# Patient Record
Sex: Male | Born: 1950 | Race: White | Hispanic: No | Marital: Single
Health system: Southern US, Community
[De-identification: ages and names within clinical notes are randomized; demographics above are authoritative.]

---

## 2004-08-24 ENCOUNTER — Emergency Department: Payer: Self-pay | Admitting: Emergency Medicine

## 2009-03-28 ENCOUNTER — Ambulatory Visit: Payer: Self-pay | Admitting: Vascular Surgery

## 2011-06-07 ENCOUNTER — Ambulatory Visit: Payer: Self-pay

## 2011-06-15 ENCOUNTER — Inpatient Hospital Stay: Payer: Self-pay | Admitting: Internal Medicine

## 2011-06-15 LAB — CBC
HCT: 33.2 % — ABNORMAL LOW (ref 40.0–52.0)
HGB: 10.4 g/dL — ABNORMAL LOW (ref 13.0–18.0)
MCH: 25.6 pg — ABNORMAL LOW (ref 26.0–34.0)
MCHC: 31.2 g/dL — ABNORMAL LOW (ref 32.0–36.0)
RDW: 21.9 % — ABNORMAL HIGH (ref 11.5–14.5)

## 2011-06-15 LAB — COMPREHENSIVE METABOLIC PANEL
Albumin: 3.1 g/dL — ABNORMAL LOW (ref 3.4–5.0)
Alkaline Phosphatase: 95 U/L (ref 50–136)
Anion Gap: 9 (ref 7–16)
Bilirubin,Total: 0.8 mg/dL (ref 0.2–1.0)
Calcium, Total: 7.9 mg/dL — ABNORMAL LOW (ref 8.5–10.1)
Co2: 34 mmol/L — ABNORMAL HIGH (ref 21–32)
Creatinine: 4.4 mg/dL — ABNORMAL HIGH (ref 0.60–1.30)
EGFR (Non-African Amer.): 15 — ABNORMAL LOW
Glucose: 140 mg/dL — ABNORMAL HIGH (ref 65–99)
Osmolality: 287 (ref 275–301)
SGOT(AST): 20 U/L (ref 15–37)
Sodium: 142 mmol/L (ref 136–145)

## 2011-06-15 LAB — CK TOTAL AND CKMB (NOT AT ARMC)
CK, Total: 43 U/L (ref 35–232)
CK-MB: 0.7 ng/mL (ref 0.5–3.6)

## 2011-06-15 LAB — TROPONIN I
Troponin-I: <0.02 ng/mL
Troponin-I: <0.02 ng/mL

## 2011-06-15 LAB — PROTIME-INR
INR: 1.1
Prothrombin Time: 15 secs — ABNORMAL HIGH (ref 11.5–14.7)

## 2011-06-16 LAB — CBC WITH DIFFERENTIAL/PLATELET
Basophil #: 0 10*3/uL (ref 0.0–0.1)
Eosinophil #: 0.1 10*3/uL (ref 0.0–0.7)
Lymphocyte %: 18.3 %
MCH: 25.5 pg — ABNORMAL LOW (ref 26.0–34.0)
MCHC: 31 g/dL — ABNORMAL LOW (ref 32.0–36.0)
Monocyte #: 0.4 10*3/uL (ref 0.0–0.7)
Neutrophil %: 71.5 %
Platelet: 212 10*3/uL (ref 150–440)
RBC: 3.85 10*6/uL — ABNORMAL LOW (ref 4.40–5.90)
RDW: 21.8 % — ABNORMAL HIGH (ref 11.5–14.5)
WBC: 5 10*3/uL (ref 3.8–10.6)

## 2011-06-16 LAB — CK TOTAL AND CKMB (NOT AT ARMC)
CK, Total: 30 U/L — ABNORMAL LOW (ref 35–232)
CK, Total: 36 U/L (ref 35–232)
CK-MB: <0.5 ng/mL — ABNORMAL LOW (ref 0.5–3.6)
CK-MB: 0.7 ng/mL (ref 0.5–3.6)

## 2011-06-16 LAB — BASIC METABOLIC PANEL
Anion Gap: 9 (ref 7–16)
Calcium, Total: 7.8 mg/dL — ABNORMAL LOW (ref 8.5–10.1)
Chloride: 99 mmol/L (ref 98–107)
EGFR (Non-African Amer.): 11 — ABNORMAL LOW
Glucose: 86 mg/dL (ref 65–99)
Osmolality: 280 (ref 275–301)
Potassium: 3 mmol/L — ABNORMAL LOW (ref 3.5–5.1)

## 2011-06-16 LAB — TROPONIN I: Troponin-I: <0.02 ng/mL

## 2011-06-17 LAB — CBC WITH DIFFERENTIAL/PLATELET
Basophil #: 0 10*3/uL (ref 0.0–0.1)
Basophil %: 0.6 %
Eosinophil #: 0.2 10*3/uL (ref 0.0–0.7)
Eosinophil %: 2.6 %
HCT: 30.3 % — ABNORMAL LOW (ref 40.0–52.0)
HGB: 9.5 g/dL — ABNORMAL LOW (ref 13.0–18.0)
Lymphocyte #: 1 10*3/uL (ref 1.0–3.6)
Lymphocyte %: 16.5 %
MCH: 25.7 pg — ABNORMAL LOW (ref 26.0–34.0)
MCHC: 31.5 g/dL — ABNORMAL LOW (ref 32.0–36.0)
MCV: 82 fL (ref 80–100)
Monocyte #: 0.4 10*3/uL (ref 0.0–0.7)
Monocyte %: 7 %
Neutrophil #: 4.4 10*3/uL (ref 1.4–6.5)
Neutrophil %: 73.3 %
Platelet: 209 10*3/uL (ref 150–440)
RBC: 3.71 10*6/uL — ABNORMAL LOW (ref 4.40–5.90)
RDW: 21.2 % — ABNORMAL HIGH (ref 11.5–14.5)
WBC: 6 10*3/uL (ref 3.8–10.6)

## 2011-06-17 LAB — BASIC METABOLIC PANEL
Anion Gap: 11 (ref 7–16)
BUN: 34 mg/dL — ABNORMAL HIGH (ref 7–18)
Calcium, Total: 7.9 mg/dL — ABNORMAL LOW (ref 8.5–10.1)
Chloride: 100 mmol/L (ref 98–107)
Co2: 31 mmol/L (ref 21–32)
Creatinine: 8.15 mg/dL — ABNORMAL HIGH (ref 0.60–1.30)
EGFR (African American): 9 — ABNORMAL LOW
EGFR (Non-African Amer.): 7 — ABNORMAL LOW
Glucose: 122 mg/dL — ABNORMAL HIGH (ref 65–99)
Osmolality: 292 (ref 275–301)
Potassium: 3.9 mmol/L (ref 3.5–5.1)
Sodium: 142 mmol/L (ref 136–145)

## 2011-06-17 LAB — PROTIME-INR
INR: 1.2
Prothrombin Time: 15.2 secs — ABNORMAL HIGH (ref 11.5–14.7)

## 2011-06-17 LAB — APTT: Activated PTT: 41.1 secs — ABNORMAL HIGH (ref 23.6–35.9)

## 2011-06-18 LAB — CBC WITH DIFFERENTIAL/PLATELET
Basophil #: 0.1 10*3/uL (ref 0.0–0.1)
Basophil %: 0.7 %
Eosinophil #: 0.2 10*3/uL (ref 0.0–0.7)
HGB: 9.7 g/dL — ABNORMAL LOW (ref 13.0–18.0)
Lymphocyte #: 0.9 10*3/uL — ABNORMAL LOW (ref 1.0–3.6)
Monocyte #: 0.5 10*3/uL (ref 0.0–0.7)
Monocyte %: 5.7 %
Neutrophil #: 6.5 10*3/uL (ref 1.4–6.5)
Platelet: 245 10*3/uL (ref 150–440)
RBC: 3.77 10*6/uL — ABNORMAL LOW (ref 4.40–5.90)
WBC: 8.1 10*3/uL (ref 3.8–10.6)

## 2011-06-18 LAB — PHOSPHORUS: Phosphorus: 4.8 mg/dL (ref 2.5–4.9)

## 2011-06-19 LAB — CLOSTRIDIUM DIFFICILE BY PCR

## 2011-06-20 LAB — WBCS, STOOL

## 2011-07-04 ENCOUNTER — Other Ambulatory Visit: Payer: Self-pay

## 2011-07-05 ENCOUNTER — Inpatient Hospital Stay: Payer: Self-pay | Admitting: Student

## 2011-07-05 LAB — TSH: Thyroid Stimulating Horm: 8.02 u[IU]/mL — ABNORMAL HIGH

## 2011-07-05 LAB — COMPREHENSIVE METABOLIC PANEL
Albumin: 3.2 g/dL — ABNORMAL LOW (ref 3.4–5.0)
Alkaline Phosphatase: 93 U/L (ref 50–136)
Anion Gap: 11 (ref 7–16)
BUN: 23 mg/dL — ABNORMAL HIGH (ref 7–18)
Bilirubin,Total: 0.9 mg/dL (ref 0.2–1.0)
Calcium, Total: 8.6 mg/dL (ref 8.5–10.1)
Co2: 28 mmol/L (ref 21–32)
SGOT(AST): 15 U/L (ref 15–37)
Total Protein: 7.5 g/dL (ref 6.4–8.2)

## 2011-07-05 LAB — CBC: Platelet: 237 10*3/uL (ref 150–440)

## 2011-07-06 LAB — BASIC METABOLIC PANEL
Anion Gap: 14 (ref 7–16)
BUN: 34 mg/dL — ABNORMAL HIGH (ref 7–18)
Chloride: 102 mmol/L (ref 98–107)
Creatinine: 8.31 mg/dL — ABNORMAL HIGH (ref 0.60–1.30)
EGFR (African American): 7 — ABNORMAL LOW
EGFR (Non-African Amer.): 6 — ABNORMAL LOW
Glucose: 119 mg/dL — ABNORMAL HIGH (ref 65–99)
Osmolality: 292 (ref 275–301)
Potassium: 3.7 mmol/L (ref 3.5–5.1)
Sodium: 142 mmol/L (ref 136–145)

## 2011-07-06 LAB — CBC WITH DIFFERENTIAL/PLATELET
Basophil #: 0.1 10*3/uL (ref 0.0–0.1)
Basophil %: 1.3 %
Eosinophil #: 0.1 10*3/uL (ref 0.0–0.7)
HGB: 8.1 g/dL — ABNORMAL LOW (ref 13.0–18.0)
Lymphocyte #: 0.8 10*3/uL — ABNORMAL LOW (ref 1.0–3.6)
Lymphocyte %: 16.4 %
MCHC: 29.8 g/dL — ABNORMAL LOW (ref 32.0–36.0)
MCV: 81 fL (ref 80–100)
Monocyte #: 0.4 x10 3/mm (ref 0.2–1.0)
Monocyte %: 7.3 %
Neutrophil %: 72.5 %
Platelet: 238 10*3/uL (ref 150–440)
RDW: 21.5 % — ABNORMAL HIGH (ref 11.5–14.5)
WBC: 4.9 10*3/uL (ref 3.8–10.6)

## 2011-07-06 LAB — PHOSPHORUS: Phosphorus: 6.1 mg/dL — ABNORMAL HIGH (ref 2.5–4.9)

## 2011-07-07 LAB — CBC WITH DIFFERENTIAL/PLATELET
Basophil #: 0.1 10*3/uL (ref 0.0–0.1)
Basophil %: 1 %
Eosinophil #: 0.1 10*3/uL (ref 0.0–0.7)
Eosinophil %: 1.7 %
HCT: 32.2 % — ABNORMAL LOW (ref 40.0–52.0)
HGB: 10 g/dL — ABNORMAL LOW (ref 13.0–18.0)
Lymphocyte #: 0.7 10*3/uL — ABNORMAL LOW (ref 1.0–3.6)
Lymphocyte %: 12.9 %
MCH: 25.2 pg — ABNORMAL LOW (ref 26.0–34.0)
MCHC: 31.2 g/dL — ABNORMAL LOW (ref 32.0–36.0)
MCV: 81 fL (ref 80–100)
Monocyte #: 0.4 x10 3/mm (ref 0.2–1.0)
Monocyte %: 6.7 %
Neutrophil #: 4.4 10*3/uL (ref 1.4–6.5)
Neutrophil %: 77.7 %
Platelet: 212 10*3/uL (ref 150–440)
RBC: 3.97 10*6/uL — ABNORMAL LOW (ref 4.40–5.90)
RDW: 20.2 % — ABNORMAL HIGH (ref 11.5–14.5)
WBC: 5.7 10*3/uL (ref 3.8–10.6)

## 2011-07-09 LAB — CBC WITH DIFFERENTIAL/PLATELET
Basophil #: 0.1 10*3/uL (ref 0.0–0.1)
Basophil %: 1.3 %
HGB: 9.4 g/dL — ABNORMAL LOW (ref 13.0–18.0)
Lymphocyte %: 13.7 %
MCH: 25.2 pg — ABNORMAL LOW (ref 26.0–34.0)
MCHC: 31.5 g/dL — ABNORMAL LOW (ref 32.0–36.0)
Monocyte #: 0.5 x10 3/mm (ref 0.2–1.0)
Monocyte %: 9.1 %
Platelet: 204 10*3/uL (ref 150–440)
WBC: 5.6 10*3/uL (ref 3.8–10.6)

## 2011-07-09 LAB — RENAL FUNCTION PANEL
Anion Gap: 15 (ref 7–16)
BUN: 42 mg/dL — ABNORMAL HIGH (ref 7–18)
Calcium, Total: 8.1 mg/dL — ABNORMAL LOW (ref 8.5–10.1)
Co2: 27 mmol/L (ref 21–32)
Creatinine: 10 mg/dL — ABNORMAL HIGH (ref 0.60–1.30)
EGFR (African American): 6 — ABNORMAL LOW
EGFR (Non-African Amer.): 5 — ABNORMAL LOW
Glucose: 58 mg/dL — ABNORMAL LOW (ref 65–99)
Potassium: 4 mmol/L (ref 3.5–5.1)

## 2011-07-09 LAB — HEMOGLOBIN: HGB: 9.8 g/dL — ABNORMAL LOW (ref 13.0–18.0)

## 2011-07-10 LAB — HEMOGLOBIN: HGB: 10.1 g/dL — ABNORMAL LOW (ref 13.0–18.0)

## 2011-07-16 LAB — PATHOLOGY REPORT

## 2011-07-27 ENCOUNTER — Ambulatory Visit: Payer: Self-pay | Admitting: Surgery

## 2011-07-27 LAB — CBC WITH DIFFERENTIAL/PLATELET
HGB: 8.9 g/dL — ABNORMAL LOW (ref 13.0–18.0)
Lymphocyte #: 0.7 10*3/uL — ABNORMAL LOW (ref 1.0–3.6)
MCH: 24.6 pg — ABNORMAL LOW (ref 26.0–34.0)
Monocyte #: 0.5 x10 3/mm (ref 0.2–1.0)
Neutrophil #: 4.5 10*3/uL (ref 1.4–6.5)
Neutrophil %: 77.4 %
Platelet: 285 10*3/uL (ref 150–440)
RDW: 20.9 % — ABNORMAL HIGH (ref 11.5–14.5)

## 2011-07-27 LAB — BASIC METABOLIC PANEL
Creatinine: 3.57 mg/dL — ABNORMAL HIGH (ref 0.60–1.30)
EGFR (African American): 20 — ABNORMAL LOW
EGFR (Non-African Amer.): 18 — ABNORMAL LOW
Glucose: 140 mg/dL — ABNORMAL HIGH (ref 65–99)
Osmolality: 284 (ref 275–301)
Potassium: 3.6 mmol/L (ref 3.5–5.1)

## 2011-07-27 LAB — PROTIME-INR: Prothrombin Time: 15.2 secs — ABNORMAL HIGH (ref 11.5–14.7)

## 2011-08-03 LAB — LIPASE, BLOOD: Lipase: 76 U/L (ref 73–393)

## 2011-08-03 LAB — COMPREHENSIVE METABOLIC PANEL
Alkaline Phosphatase: 97 U/L (ref 50–136)
Chloride: 103 mmol/L (ref 98–107)
Co2: 27 mmol/L (ref 21–32)
Creatinine: 8.59 mg/dL — ABNORMAL HIGH (ref 0.60–1.30)
Osmolality: 297 (ref 275–301)
Potassium: 4.7 mmol/L (ref 3.5–5.1)
SGOT(AST): 19 U/L (ref 15–37)
SGPT (ALT): 18 U/L
Total Protein: 8 g/dL (ref 6.4–8.2)

## 2011-08-03 LAB — TSH: Thyroid Stimulating Horm: 4.89 u[IU]/mL — ABNORMAL HIGH

## 2011-08-03 LAB — CBC
HCT: 30.1 % — ABNORMAL LOW (ref 40.0–52.0)
HGB: 9.4 g/dL — ABNORMAL LOW (ref 12.0–16.0)
MCHC: 31.1 g/dL — ABNORMAL LOW (ref 32.0–36.0)
RDW: 20.6 % — ABNORMAL HIGH (ref 11.5–14.5)

## 2011-08-03 LAB — PROTIME-INR
INR: 1.1
Prothrombin Time: 14.6 secs (ref 11.5–14.7)

## 2011-08-03 LAB — POTASSIUM: Potassium: 4.9 mmol/L (ref 3.5–5.1)

## 2011-08-04 LAB — BASIC METABOLIC PANEL
Anion Gap: 9 (ref 7–16)
BUN: 33 mg/dL — ABNORMAL HIGH (ref 7–18)
Chloride: 97 mmol/L — ABNORMAL LOW (ref 98–107)
Co2: 31 mmol/L (ref 21–32)
EGFR (African American): 11 — ABNORMAL LOW
Glucose: 104 mg/dL — ABNORMAL HIGH (ref 65–99)
Potassium: 4.6 mmol/L (ref 3.5–5.1)
Sodium: 137 mmol/L (ref 136–145)

## 2011-08-04 LAB — CBC WITH DIFFERENTIAL/PLATELET
Basophil %: 0.9 %
Eosinophil #: 0.1 10*3/uL (ref 0.0–0.7)
HGB: 9.1 g/dL — ABNORMAL LOW (ref 12.0–16.0)
Lymphocyte #: 1 10*3/uL (ref 1.0–3.6)
Lymphocyte %: 13.8 %
MCH: 24.4 pg — ABNORMAL LOW (ref 26.0–34.0)
MCHC: 30.5 g/dL — ABNORMAL LOW (ref 32.0–36.0)
MCV: 80 fL (ref 80–100)
Monocyte #: 0.7 10*3/uL (ref 0.2–1.0)
Monocyte %: 9.2 %
Neutrophil #: 5.3 10*3/uL (ref 1.4–6.5)
Neutrophil %: 74.9 %
RBC: 3.75 10*6/uL — ABNORMAL LOW (ref 4.40–5.90)

## 2011-08-07 ENCOUNTER — Inpatient Hospital Stay: Payer: Self-pay | Admitting: Internal Medicine

## 2011-08-08 LAB — CBC WITH DIFFERENTIAL/PLATELET
Basophil %: 0.7 %
Eosinophil #: 0 10*3/uL (ref 0.0–0.7)
Eosinophil %: 0.3 %
HCT: 28.6 % — ABNORMAL LOW (ref 40.0–52.0)
Lymphocyte #: 0.8 10*3/uL — ABNORMAL LOW (ref 1.0–3.6)
MCH: 24.4 pg — ABNORMAL LOW (ref 26.0–34.0)
MCHC: 30.8 g/dL — ABNORMAL LOW (ref 32.0–36.0)
Monocyte #: 0.9 10*3/uL (ref 0.2–1.0)
Monocyte %: 9.5 %
Neutrophil #: 7.8 10*3/uL — ABNORMAL HIGH (ref 1.4–6.5)
Platelet: 323 10*3/uL (ref 150–440)
RBC: 3.61 10*6/uL — ABNORMAL LOW (ref 4.40–5.90)
WBC: 9.7 10*3/uL (ref 3.8–10.6)

## 2011-08-08 LAB — BASIC METABOLIC PANEL
BUN: 52 mg/dL — ABNORMAL HIGH (ref 7–18)
Calcium, Total: 9.4 mg/dL (ref 8.5–10.1)
Creatinine: 9.36 mg/dL — ABNORMAL HIGH (ref 0.60–1.30)
Glucose: 106 mg/dL — ABNORMAL HIGH (ref 65–99)
Osmolality: 292 (ref 275–301)
Potassium: 5.7 mmol/L — ABNORMAL HIGH (ref 3.5–5.1)
Sodium: 139 mmol/L (ref 136–145)

## 2011-08-08 LAB — PHOSPHORUS: Phosphorus: 8.1 mg/dL — ABNORMAL HIGH (ref 2.5–4.9)

## 2011-08-10 LAB — CBC WITH DIFFERENTIAL/PLATELET
Basophil #: 0.1 10*3/uL (ref 0.0–0.1)
Eosinophil %: 1.7 %
HCT: 25.9 % — ABNORMAL LOW (ref 40.0–52.0)
HGB: 8.1 g/dL — ABNORMAL LOW (ref 12.0–16.0)
Lymphocyte #: 0.6 10*3/uL — ABNORMAL LOW (ref 1.0–3.6)
Lymphocyte %: 6.7 %
MCHC: 31.1 g/dL — ABNORMAL LOW (ref 32.0–36.0)
MCV: 79 fL — ABNORMAL LOW (ref 80–100)
Monocyte #: 0.4 10*3/uL (ref 0.2–1.0)
Monocyte %: 4.2 %
Neutrophil %: 86.8 %
Platelet: 363 10*3/uL (ref 150–440)
RBC: 3.27 10*6/uL — ABNORMAL LOW (ref 4.40–5.90)
WBC: 8.8 10*3/uL (ref 3.8–10.6)

## 2011-08-10 LAB — RENAL FUNCTION PANEL
Chloride: 97 mmol/L — ABNORMAL LOW (ref 98–107)
Co2: 31 mmol/L (ref 21–32)
Creatinine: 7.04 mg/dL — ABNORMAL HIGH (ref 0.60–1.30)
Potassium: 3.9 mmol/L (ref 3.5–5.1)

## 2011-08-10 LAB — AMMONIA: Ammonia, Plasma: <25 mcmol/L (ref 11–32)

## 2011-08-11 LAB — CBC WITH DIFFERENTIAL/PLATELET
Basophil #: 0.1 10*3/uL (ref 0.0–0.1)
Eosinophil #: 0.1 10*3/uL (ref 0.0–0.7)
Eosinophil %: 1.2 %
HGB: 7.8 g/dL — ABNORMAL LOW (ref 12.0–16.0)
Lymphocyte #: 0.7 10*3/uL — ABNORMAL LOW (ref 1.0–3.6)
Lymphocyte %: 6.6 %
MCH: 24.6 pg — ABNORMAL LOW (ref 26.0–34.0)
MCV: 80 fL (ref 80–100)
Monocyte #: 1 10*3/uL (ref 0.2–1.0)
Monocyte %: 9.5 %
Neutrophil %: 82 %
Platelet: 357 10*3/uL (ref 150–440)
RBC: 3.15 10*6/uL — ABNORMAL LOW (ref 4.40–5.90)
WBC: 10.5 10*3/uL (ref 3.8–10.6)

## 2011-08-12 LAB — IRON AND TIBC
Iron Saturation: 15 %
Iron: 18 ug/dL — ABNORMAL LOW (ref 50–175)
Unbound Iron-Bind.Cap.: 103 ug/dL

## 2011-08-13 LAB — RENAL FUNCTION PANEL
Albumin: 2 g/dL — ABNORMAL LOW (ref 3.4–5.0)
Anion Gap: 11 (ref 7–16)
Co2: 27 mmol/L (ref 21–32)
Creatinine: 11.5 mg/dL — ABNORMAL HIGH (ref 0.60–1.30)
Osmolality: 291 (ref 275–301)
Phosphorus: 4.3 mg/dL (ref 2.5–4.9)
Potassium: 4.2 mmol/L (ref 3.5–5.1)
Sodium: 135 mmol/L — ABNORMAL LOW (ref 136–145)

## 2011-08-13 LAB — CBC WITH DIFFERENTIAL/PLATELET
Basophil #: 0.1 10*3/uL (ref 0.0–0.1)
Basophil %: 1.1 %
Eosinophil #: 0.3 10*3/uL (ref 0.0–0.7)
Lymphocyte #: 1 10*3/uL (ref 1.0–3.6)
MCH: 23.8 pg — ABNORMAL LOW (ref 26.0–34.0)
MCV: 79 fL — ABNORMAL LOW (ref 80–100)
Neutrophil #: 10.6 10*3/uL — ABNORMAL HIGH (ref 1.4–6.5)
Neutrophil %: 81.9 %
Platelet: 386 10*3/uL (ref 150–440)
RBC: 2.97 10*6/uL — ABNORMAL LOW (ref 4.40–5.90)
RDW: 19.4 % — ABNORMAL HIGH (ref 11.5–14.5)

## 2011-08-14 LAB — CBC WITH DIFFERENTIAL/PLATELET
Basophil #: 0.1 10*3/uL (ref 0.0–0.1)
Basophil %: 0.8 %
Eosinophil #: 0.2 10*3/uL (ref 0.0–0.7)
Eosinophil %: 1.2 %
MCH: 25.1 pg — ABNORMAL LOW (ref 26.0–34.0)
MCHC: 30.9 g/dL — ABNORMAL LOW (ref 32.0–36.0)
Platelet: 326 10*3/uL (ref 150–440)
RDW: 19.3 % — ABNORMAL HIGH (ref 11.5–14.5)

## 2011-08-15 LAB — PHOSPHORUS: Phosphorus: 5.2 mg/dL — ABNORMAL HIGH (ref 2.5–4.9)

## 2011-08-15 LAB — WBC: WBC: 11.3 10*3/uL — ABNORMAL HIGH (ref 3.8–10.6)

## 2011-08-15 LAB — HEMOGLOBIN: HGB: 8.4 g/dL — ABNORMAL LOW (ref 12.0–16.0)

## 2011-08-16 LAB — CULTURE, BLOOD (SINGLE)

## 2011-08-17 LAB — CBC WITH DIFFERENTIAL/PLATELET
Basophil #: 0.1 10*3/uL (ref 0.0–0.1)
Basophil %: 1 %
Eosinophil #: 0.3 10*3/uL (ref 0.0–0.7)
HGB: 8.3 g/dL — ABNORMAL LOW (ref 12.0–16.0)
Lymphocyte %: 7.4 %
MCH: 24.2 pg — ABNORMAL LOW (ref 26.0–34.0)
MCHC: 29.3 g/dL — ABNORMAL LOW (ref 32.0–36.0)
MCV: 83 fL (ref 80–100)
Monocyte #: 0.7 10*3/uL (ref 0.2–1.0)
Monocyte %: 6.5 %
Neutrophil %: 82.5 %
Platelet: 289 10*3/uL (ref 150–440)
WBC: 11.4 10*3/uL — ABNORMAL HIGH (ref 3.8–10.6)

## 2011-08-17 LAB — RENAL FUNCTION PANEL
Albumin: 1.7 g/dL — ABNORMAL LOW (ref 3.4–5.0)
Anion Gap: 12 (ref 7–16)
BUN: 43 mg/dL — ABNORMAL HIGH (ref 7–18)
Calcium, Total: 9.6 mg/dL (ref 8.5–10.1)
Chloride: 98 mmol/L (ref 98–107)
Co2: 30 mmol/L (ref 21–32)
Creatinine: 7.6 mg/dL — ABNORMAL HIGH (ref 0.60–1.30)
Glucose: 160 mg/dL — ABNORMAL HIGH (ref 65–99)
Phosphorus: 2.6 mg/dL (ref 2.5–4.9)
Potassium: 3.9 mmol/L (ref 3.5–5.1)
Sodium: 140 mmol/L (ref 136–145)

## 2011-08-18 ENCOUNTER — Ambulatory Visit: Payer: Self-pay | Admitting: Internal Medicine

## 2011-08-18 LAB — BASIC METABOLIC PANEL
BUN: 31 mg/dL — ABNORMAL HIGH (ref 7–18)
Calcium, Total: 9.7 mg/dL (ref 8.5–10.1)
Chloride: 98 mmol/L (ref 98–107)
Co2: 32 mmol/L (ref 21–32)
Creatinine: 5.94 mg/dL — ABNORMAL HIGH (ref 0.60–1.30)
Potassium: 3.9 mmol/L (ref 3.5–5.1)

## 2011-08-18 LAB — CULTURE, BLOOD (SINGLE)

## 2011-08-20 LAB — CBC WITH DIFFERENTIAL/PLATELET
Eosinophil %: 2.7 %
HCT: 25.3 % — ABNORMAL LOW (ref 40.0–52.0)
Lymphocyte #: 0.7 10*3/uL — ABNORMAL LOW (ref 1.0–3.6)
MCH: 24.9 pg — ABNORMAL LOW (ref 26.0–34.0)
MCHC: 30.6 g/dL — ABNORMAL LOW (ref 32.0–36.0)
MCV: 81 fL (ref 80–100)
Monocyte %: 2.5 %
Neutrophil #: 9.6 10*3/uL — ABNORMAL HIGH (ref 1.4–6.5)
Neutrophil %: 87 %
RBC: 3.11 10*6/uL — ABNORMAL LOW (ref 4.40–5.90)
RDW: 19.6 % — ABNORMAL HIGH (ref 11.5–14.5)
WBC: 11 10*3/uL — ABNORMAL HIGH (ref 3.8–10.6)

## 2011-08-20 LAB — RENAL FUNCTION PANEL
Albumin: 1.7 g/dL — ABNORMAL LOW (ref 3.4–5.0)
BUN: 54 mg/dL — ABNORMAL HIGH (ref 7–18)
Calcium, Total: 9.3 mg/dL (ref 8.5–10.1)
Chloride: 97 mmol/L — ABNORMAL LOW (ref 98–107)
Glucose: 146 mg/dL — ABNORMAL HIGH (ref 65–99)
Osmolality: 291 (ref 275–301)
Potassium: 4.1 mmol/L (ref 3.5–5.1)
Sodium: 137 mmol/L (ref 136–145)

## 2011-08-21 LAB — CEA: CEA: 8.7 ng/mL — ABNORMAL HIGH (ref 0.0–4.7)

## 2011-08-22 LAB — CBC WITH DIFFERENTIAL/PLATELET
Basophil #: 0.2 10*3/uL — ABNORMAL HIGH (ref 0.0–0.1)
Eosinophil #: 0.3 10*3/uL (ref 0.0–0.7)
Eosinophil %: 2.4 %
HCT: 23.7 % — ABNORMAL LOW (ref 40.0–52.0)
Lymphocyte %: 7 %
MCH: 24.6 pg — ABNORMAL LOW (ref 26.0–34.0)
MCV: 81 fL (ref 80–100)
Monocyte %: 5.7 %
Neutrophil #: 11.6 10*3/uL — ABNORMAL HIGH (ref 1.4–6.5)
RBC: 2.92 10*6/uL — ABNORMAL LOW (ref 4.40–5.90)
RDW: 19.3 % — ABNORMAL HIGH (ref 11.5–14.5)

## 2011-09-05 ENCOUNTER — Ambulatory Visit: Payer: Self-pay | Admitting: Internal Medicine

## 2011-09-05 LAB — CANCER CENTER HEMOGLOBIN: HGB: 7.7 g/dL — ABNORMAL LOW (ref 13.0–18.0)

## 2011-09-24 ENCOUNTER — Ambulatory Visit: Payer: Self-pay | Admitting: Internal Medicine

## 2011-09-24 DEATH — deceased

## 2012-08-28 IMAGING — CT CT CHEST W/ CM
1 of 2 series · 14 of 32 positions shown, 18 images · non-contrast
Comparison: none

REASON FOR EXAM: nodules and adeopathy pt to have dialysis in am
COMMENTS:

[Series 3: lung windows · axial · 0.70mm/px · z∈[-318,-45]mm · 14 of 109 slices shown, 18 images]
[im 9/109  mediastinal]
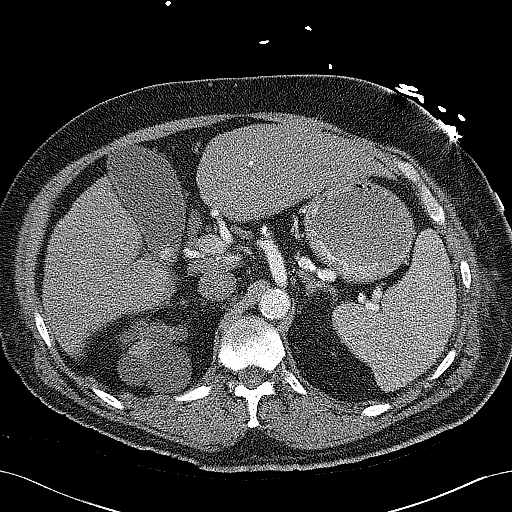
[im 9/109  lung]
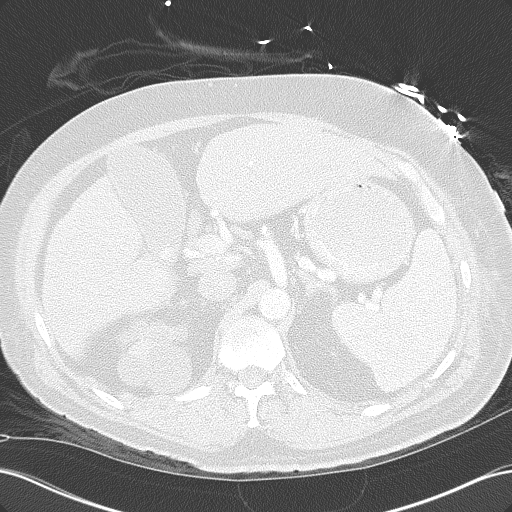
[im 17/109  lung]
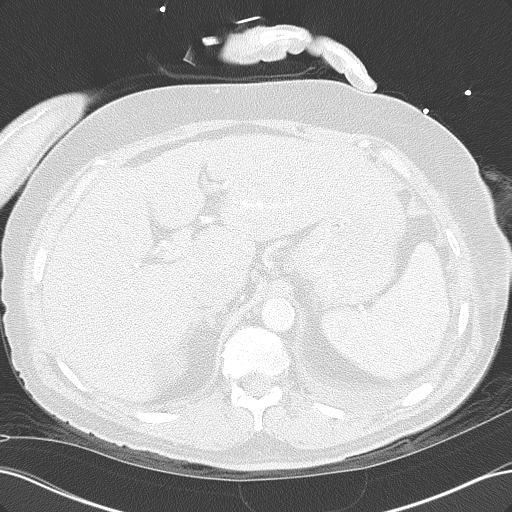
[im 25/109  lung]
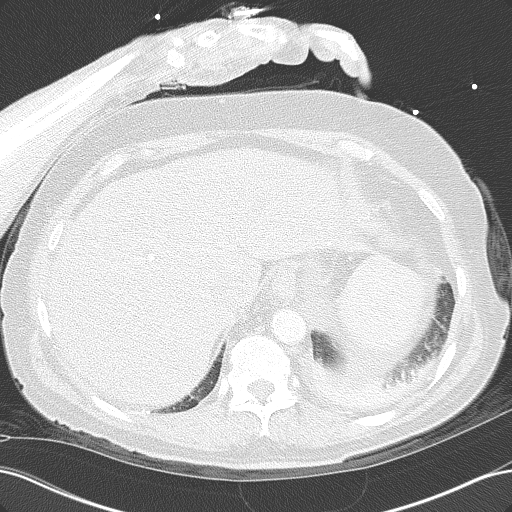
[im 34/109  lung]
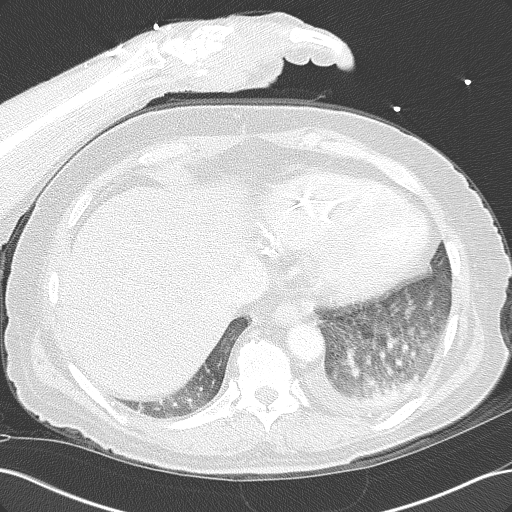
[im 42/109  mediastinal]
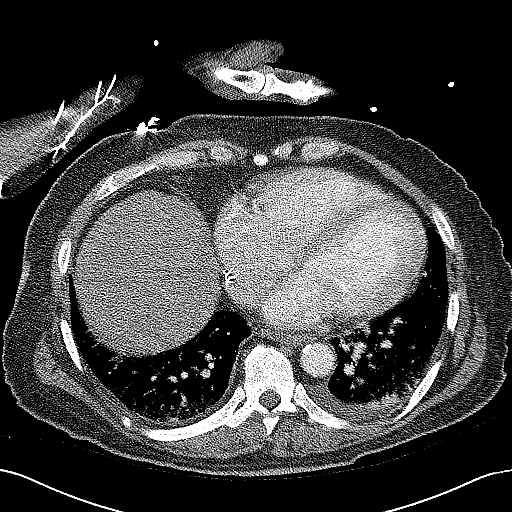
[im 42/109  lung]
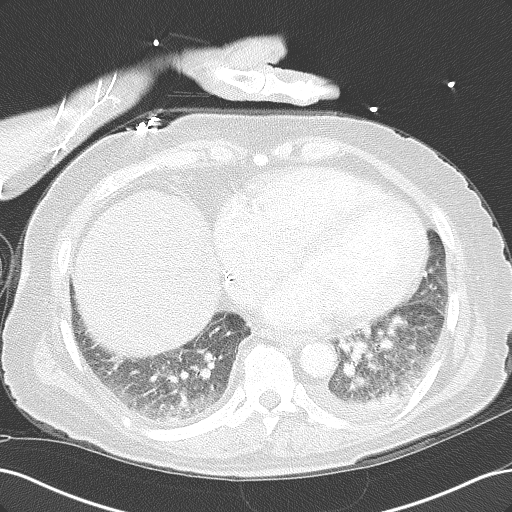
[im 50/109  lung]
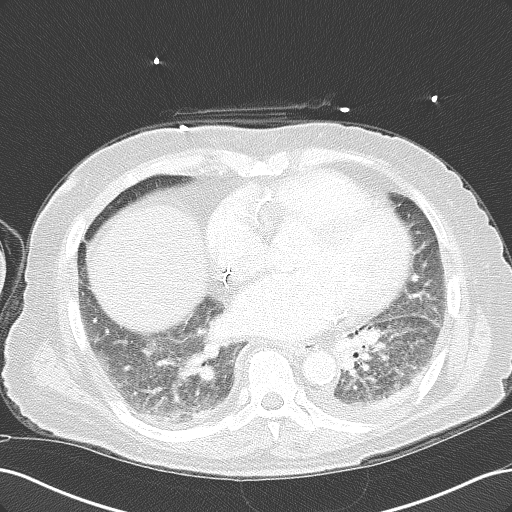
[im 51/109  lung]
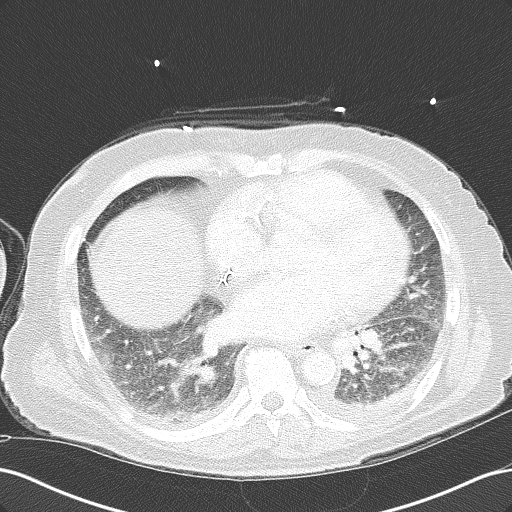
[im 55/109  lung]
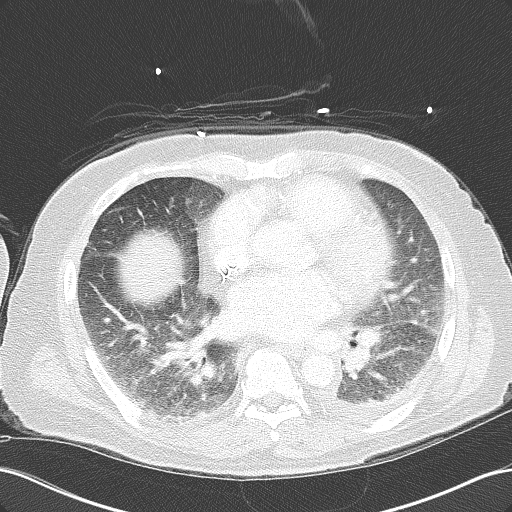
[im 59/109  mediastinal]
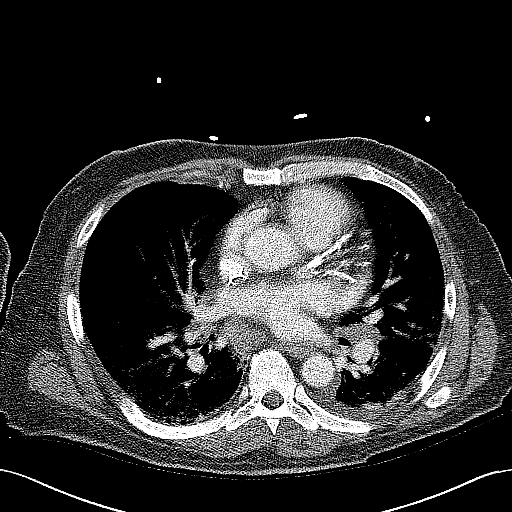
[im 59/109  lung]
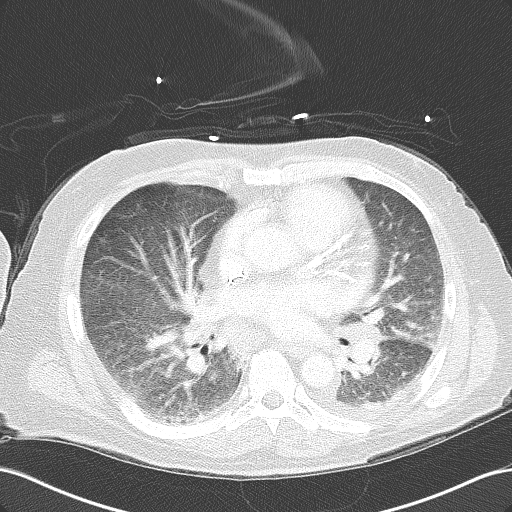
[im 67/109  lung]
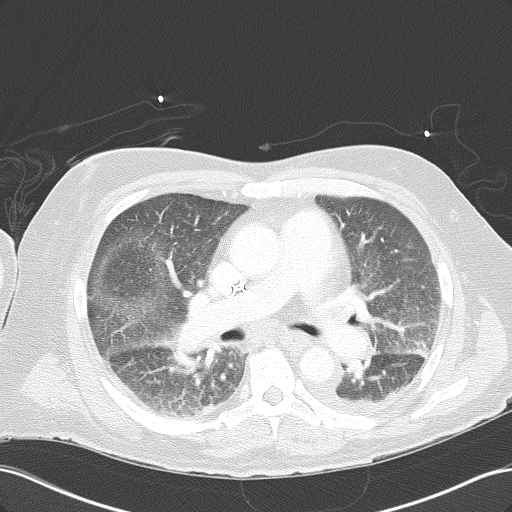
[im 75/109  lung]
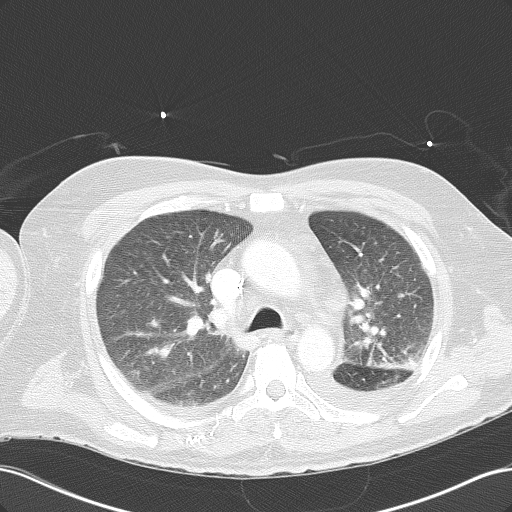
[im 84/109  lung]
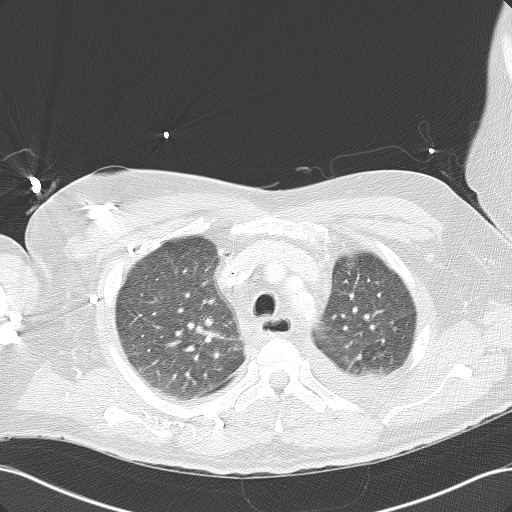
[im 92/109  mediastinal]
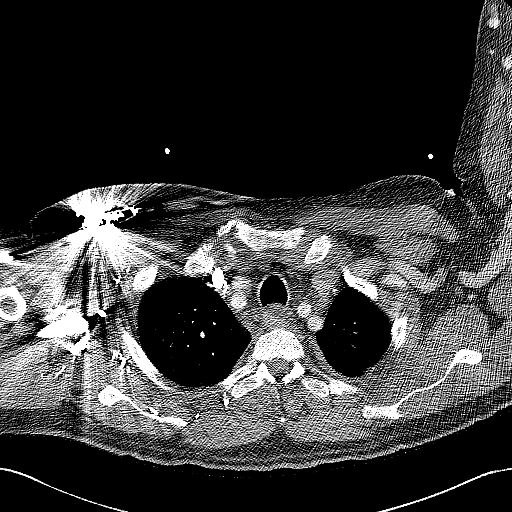
[im 92/109  lung]
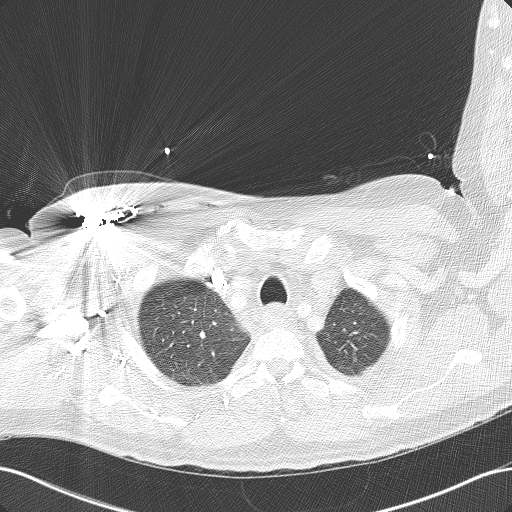
[im 100/109  lung]
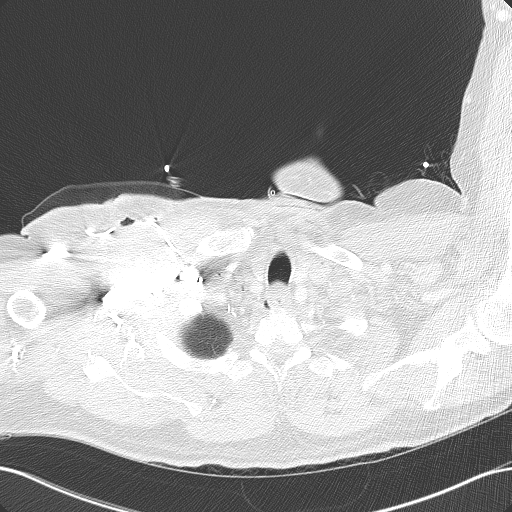

[14 of 32 positions shown; findings below may reference images not displayed]

PROCEDURE:     CT  - CT CHEST WITH CONTRAST  - June 17, 2011  [DATE]

RESULT:     Axial CT scanning was performed through the chest with
reconstructions at 3 mm intervals and slice thicknesses following
intravenous administration of 75 cc of Jsovue-JIS. Comparison is made to the
study June 15, 2010. Review of multiplanar reconstructed images was
performed separately on the VIA monitor.

At lung window settings there is stable nodularity in the anterior/inferior
aspect of the right upper lobe seen on image 46. Subtle density on image 49
adjacent to the minor fissure is stable. On image 52 an approximately 2 x 3
mm diameter density in a subpleural location posteriorly in the right lower
lobe is also stable. Subtle nodularity posteriorly in the left lower lobe on
image 71 measuring approximately 3 mm in diameter is stable. Other areas of
nodularity seen in the left lower lobe on the previous study are obscured by
increased pleural thickening and small left pleural effusion which developed
since the prior study. There are small amounts of pleural calcification
visible on today's study as well in both costophrenic gutters. Elsewhere the
pulmonary parenchyma demonstrates mildly increased interstitial density
suggesting interstitial edema.

The cardiac chambers are enlarged. The caliber of the thoracic aorta is
normal. Contrast within the central pulmonary vascular tree is normal. There
is a stable lymph node in the AP window anteriorly on image 33 which
measures approximately 1.7 cm in greatest dimension. More posteriorly in the
AP window there are prominent but stable appearing lymph nodes. There is a
subcarinal lymph node which is stable measuring 1.9 cm in long axis seen
best on image 43. There are additional lymph nodes posterior to the right
pulmonary venous confluence which have not dramatically changed since the
earlier study and measure 2.4 cm in aggregate in long axis. There are
borderline enlarged hilar lymph nodes which are demonstrated to better
advantage than on the earlier study. The largest is seen on image 50 in the
left hilum and measures 1.8 cm in long axis. The presence of IV contrast on
today's study levels better evaluation of the hilar structures.

Within the upper abdomen the liver exhibits decreased density consistent
with fatty infiltration. There is a ring calcified gallstone which appear
stable. Multiple cystic structures are associated with both kidneys. The
observed portions of the pancreas and spleen and adrenal glands are normal.
IMPRESSION: 1. There is a stable appearance of pulmonary parenchymal nodularity.
2. There are stable appearing mediastinal and hilar lymph nodes.
3. There is a new small left pleural effusion. There is increased
interstitial density in both lungs consistent with pulmonary interstitial
edema.
4. The cardiac chambers are enlarged and appear to have increased since the
previous study.
5. Again demonstrated is a gallstone near the gallbladder neck.

## 2012-10-14 IMAGING — CR DG ABDOMEN 3V
1 series · 5 of 5 positions shown · non-contrast
Comparison: none

REASON FOR EXAM: Pain
COMMENTS:   May transport without cardiac monitor

PROCEDURE:     DXR - DXR ABDOMEN 3-WAY (INCL PA CXR)  - August 03, 2011  [DATE]
RESULT:     Comparison: Chest radiograph 06/17/2011

[Series 1: x chest ap · 0.14mm/px · 5 of 5 slices shown]
[im 1/5]
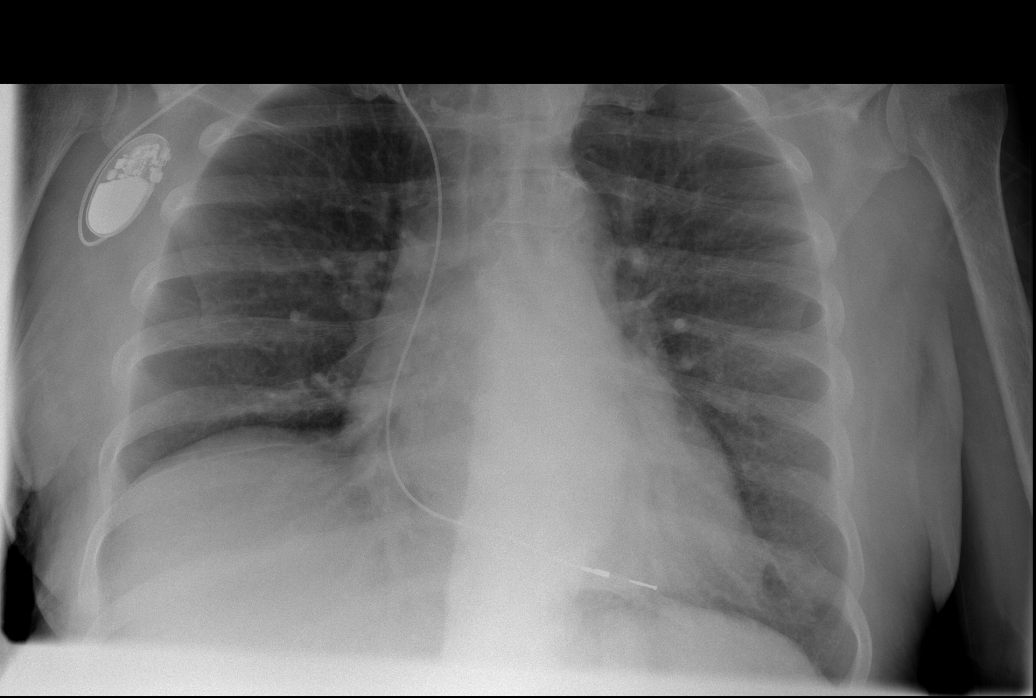
[im 2/5]
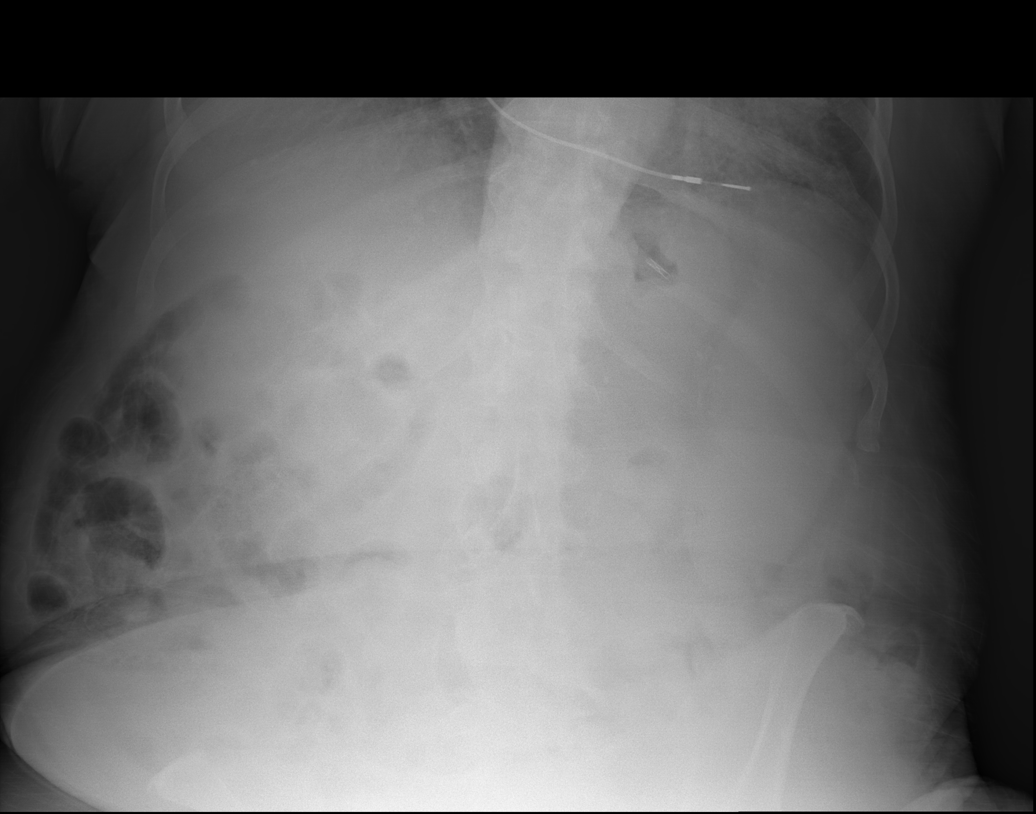
[im 3/5]
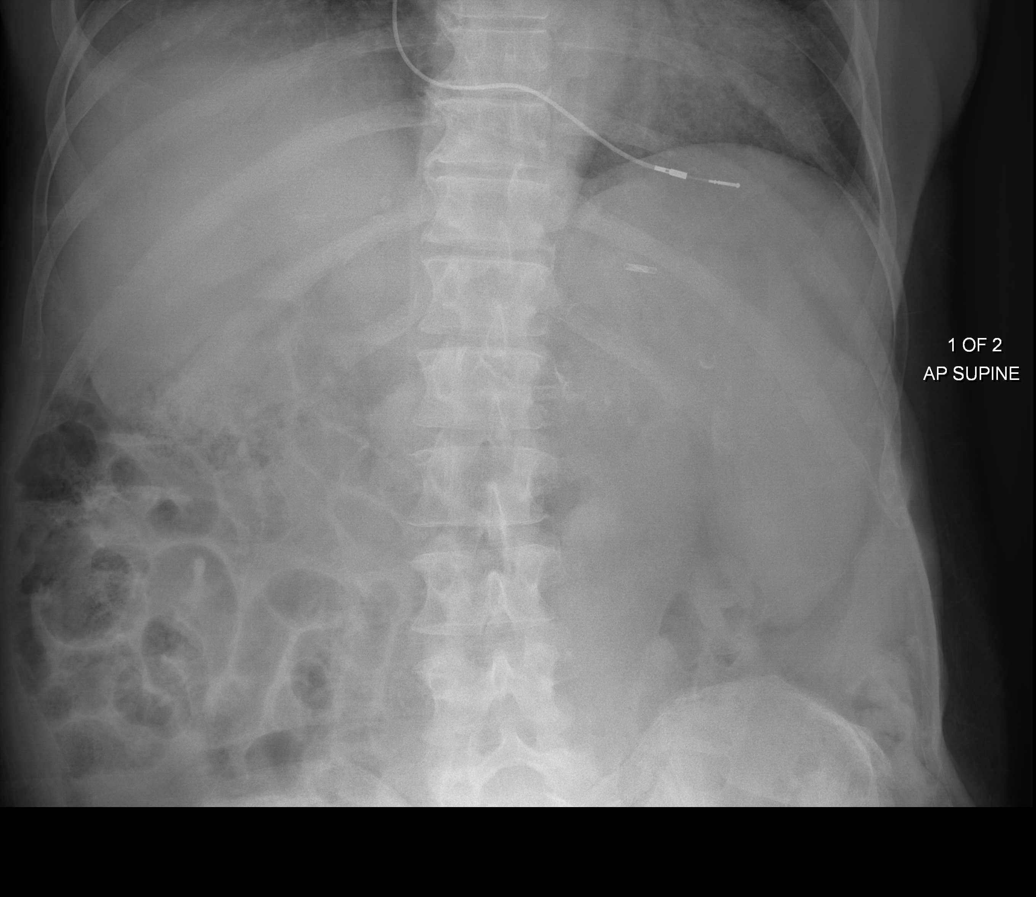
[im 4/5]
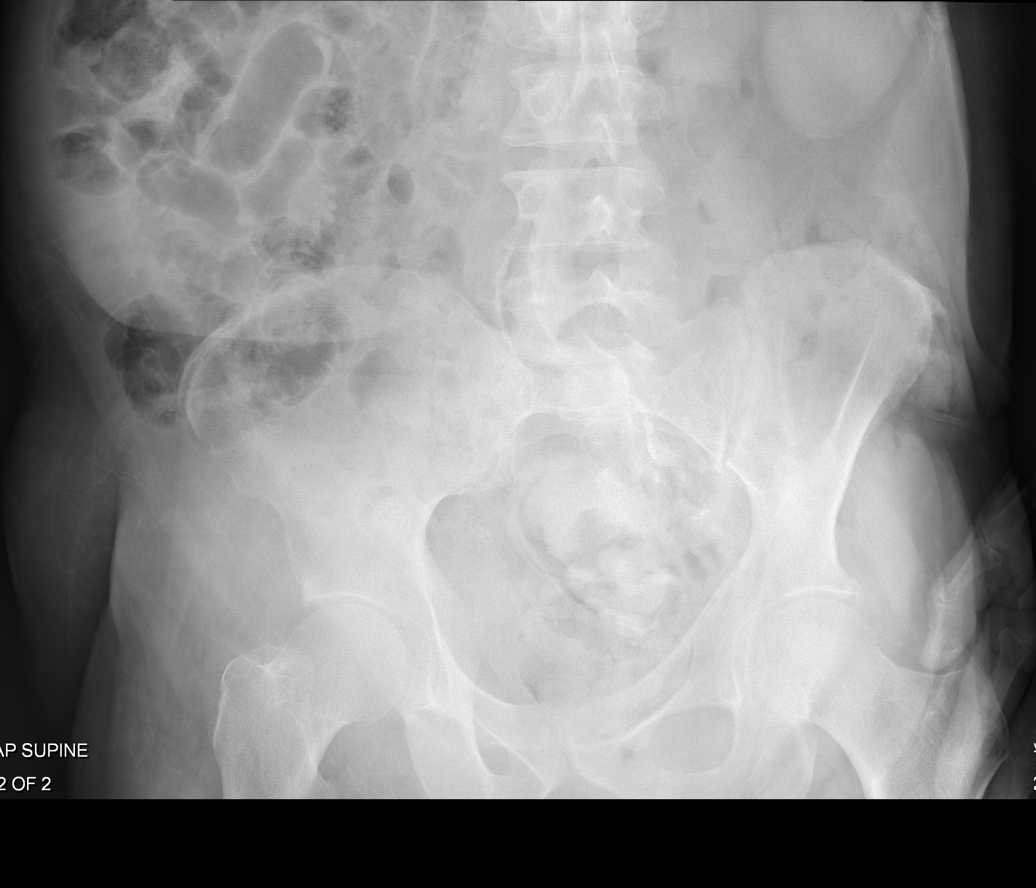
[im 5/5]
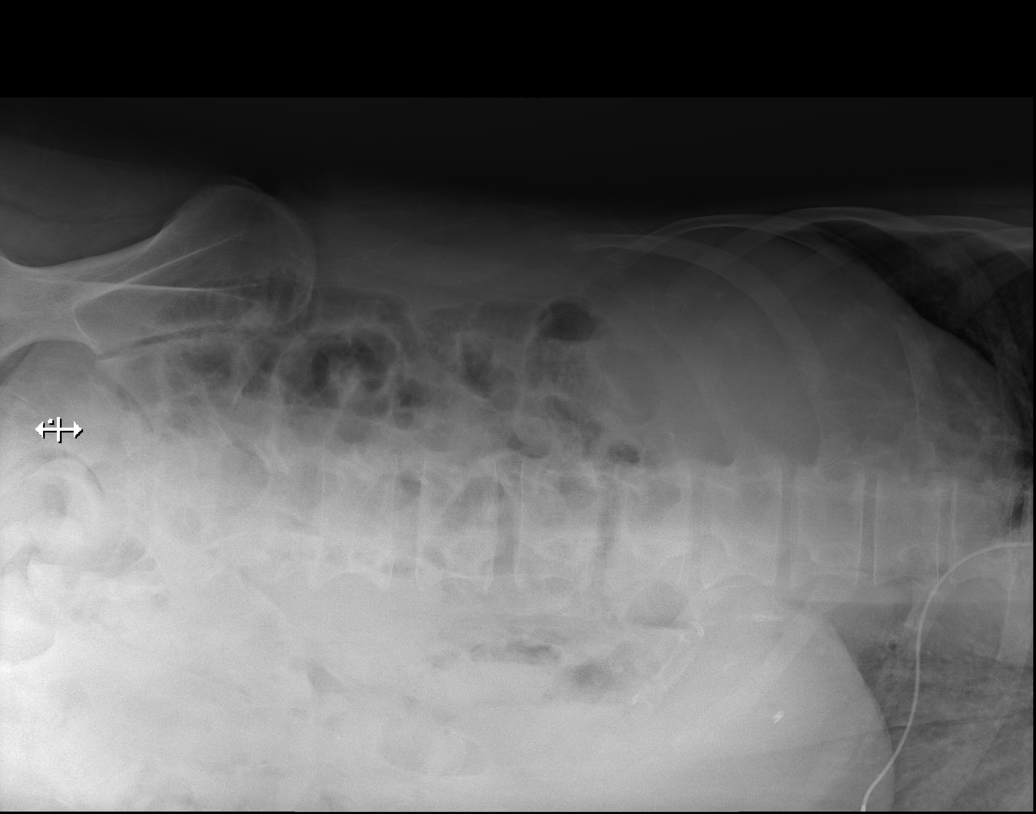

[5 of 5 positions shown; findings below may reference images not displayed]

FINDINGS: Single-lead pacemaker wires present. Cardiomegaly and the mediastinum are
similar to prior. No focal pulmonary opacities. There is mild elevation of
the left hemidiaphragm. No free intraperitoneal air. Air is seen within
nondilated small and large bowel. There is a small metallic density
overlying the left upper quadrant, which is of uncertain etiology. This to
be postsurgical or represent an ingested object.
IMPRESSION: Nonobstructed bowel gas pattern. Other findings as above.

## 2012-10-28 IMAGING — CR DG CHEST 1V PORT
1 series · 1 of 1 positions shown · non-contrast
Comparison: none

REASON FOR EXAM: cracles at bases
COMMENTS:

[ap]
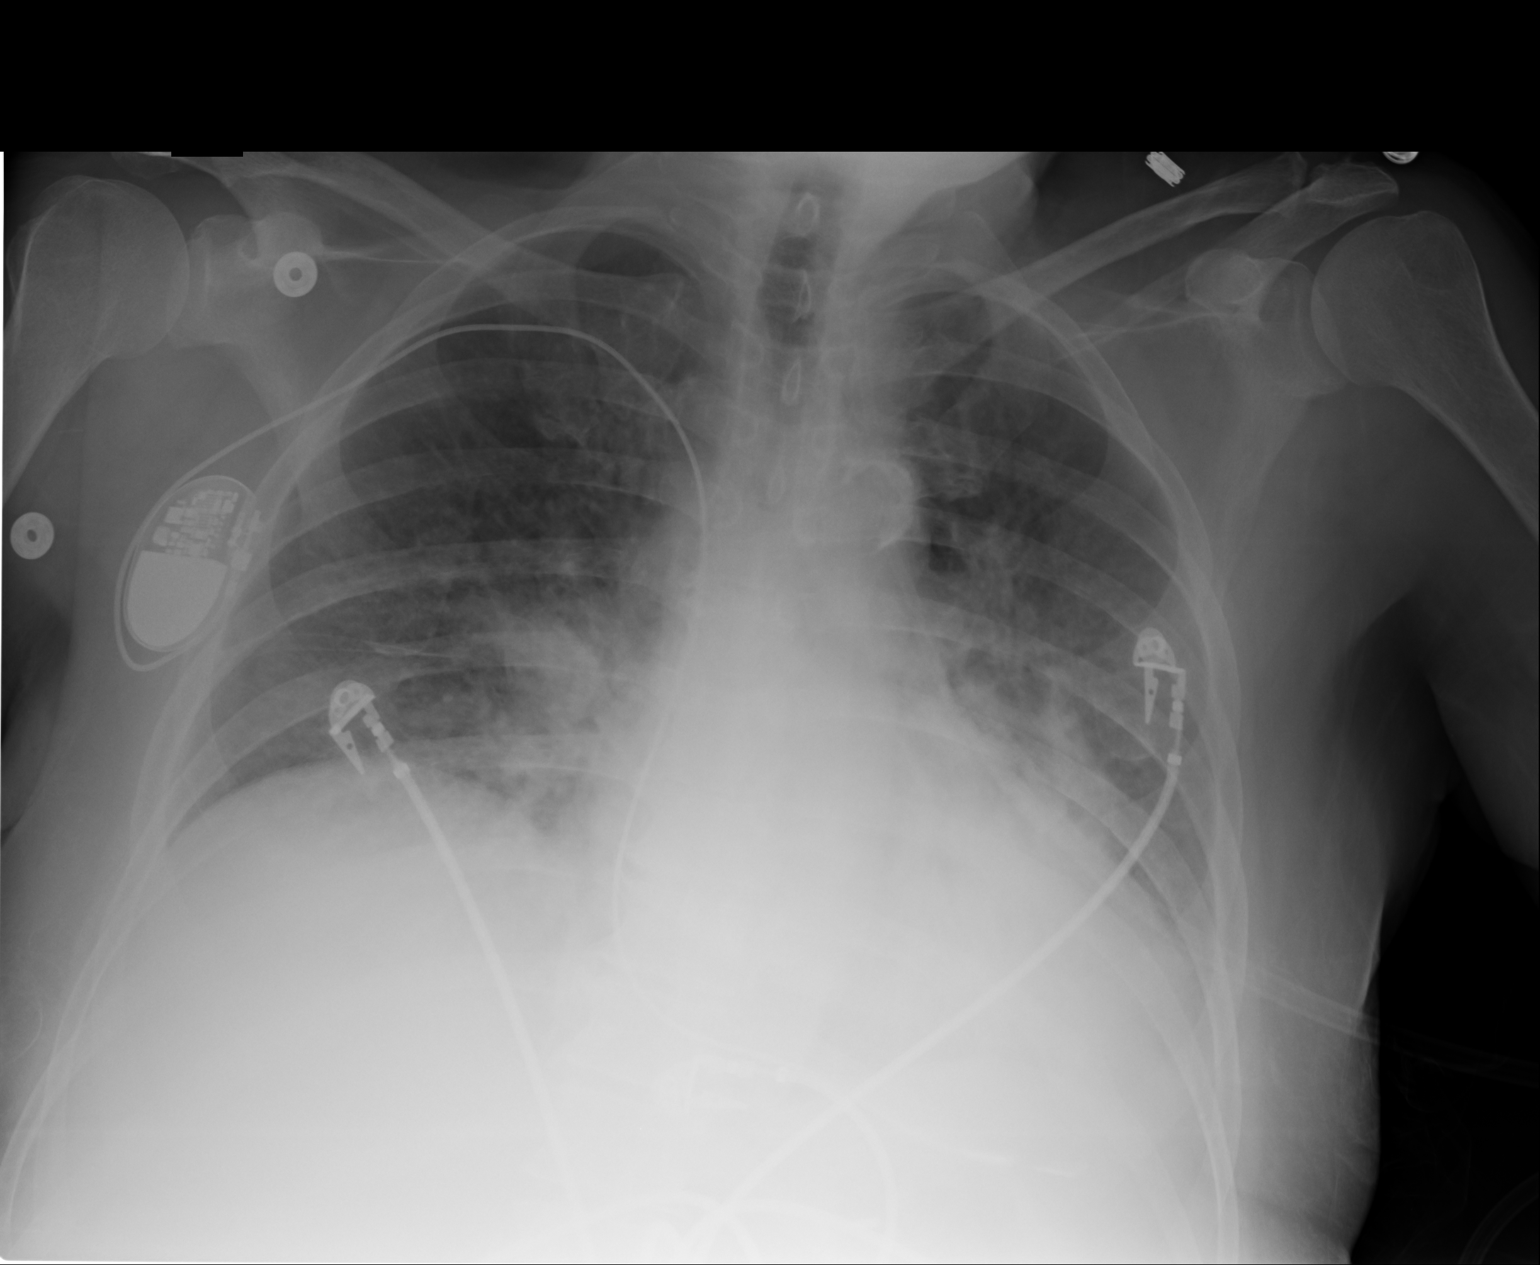

[1 of 1 positions shown; findings below may reference images not displayed]

PROCEDURE:     DXR - DXR PORTABLE CHEST SINGLE VIEW  - August 17, 2011  [DATE]

RESULT:     Comparison is made to the study 14 August, 2011.

The pulmonary vascularity remains engorged and the interstitial markings
have become slightly more conspicuous. The left hemidiaphragm is less well
demonstrated. The cardiac silhouette remains enlarged. The permanent
pacemaker appears unchanged where visualized.
IMPRESSION: The findings are consistent with worsening of pulmonary
interstitial edema.

[REDACTED]

## 2014-07-18 NOTE — Consult Note (Signed)
PATIENT NAME:  Nathan Castillo, Nathan Castillo MR#:  [id number] DATE OF BIRTH:  01-28-1951  DATE OF CONSULTATION:  07/05/2011  CONSULTING PHYSICIAN:  Nathan Jun, MD  REASON FOR CONSULTATION: The patient is a 64 year old white male who has chronic kidney failure, is on dialysis Mondays, Wednesdays, Fridays. He was found to have anemia and heme-positive stool on his last admission when he was in the hospital 06/15/2011.  He was discharged on 06/19/2011. He was seen by his primary doctor today, and because of a fall in hemoglobin and continued heme positive stool was sent to the ER and was seen by a hospitalist with intention to admit him and get transfused tomorrow when he has dialysis.   The patient was seen by Dr. Bluford Kaufmann and was set up for a potential colonoscopy. That has not been completed yet because the patient had trouble with his heart, and this needed to be attended to first. The patient had a pacemaker placement by Dr. Juel Burrow on the 24th and has done well with his heart.   PAST MEDICAL HISTORY:  1. End-stage renal disease, on hemodialysis Monday, Wednesday, Friday.  2. Hypertension.  3. Hyperlipidemia.  4. Secondary hyperparathyroidism.  5. Anemia.  6. Heme positive stool.  7. Cardiac conduction condition requiring a pacemaker.   The patient previously had a history of diarrhea for a few weeks. This resolved. His last stool was formed.   REVIEW OF SYSTEMS: He denies any chest pain. No abdominal pain. No dysphagia. No vomiting, no diarrhea. No obvious rectal bleeding. Appetite is about normal for him.   PHYSICAL EXAMINATION:  GENERAL: White male in no acute distress.   HEENT: Sclerae are anicteric. Conjunctivae are mildly pale. Tongue is slightly pale.   HEAD: Atraumatic.  NECK: Trachea is in the midline.   CHEST: Clear.   HEART: No murmurs or gallops I can hear. There is a pacemaker present.   ABDOMEN: Nondistended, soft. Bowel sounds are present. No hepatosplenomegaly. No masses. No  bruits. No significant tenderness.   EXTREMITIES: No edema.   SKIN: Warm and dry. He has an AV shunt in his left forearm.   LABORATORY DATA: Glucose 152, BUN 23, creatinine 6.87, sodium 142, potassium 3.5, chloride 103, CO2 28, calcium 8.6, total protein 7.5, albumin 3.2, total bilirubin 0.9, alkaline phosphatase 93, SGOT 15, SGPT 11. White count 4.6, hemoglobin 8.1, hematocrit 26.5, platelet count 237.   ASSESSMENT/PLAN: Heme-positive stool and anemia: I plan to do an upper endoscopy tomorrow. The incidents of erosive gastritis and ulcers is increased in patients on dialysis. I would like to know for sure that his upper does not show any active ulcers before giving him a colon prep. After the upper is done, he can have dialysis tomorrow; and then Dr. Bluford Kaufmann can decide when he wishes to do his colonoscopy. Because of his new insertion pacemaker, and because of his shunt, we will give him some antibiotics before doing the upper endoscopy. He has no known drug allergies, so we will probably give him some Zosyn before the procedure, 3.375 grams IV.   MEDICATIONS:  1. Aspirin 81 mg a day.  2. Nifedipine 90 mg a day.  3. Cetirizine 10 mg a day. 4. Cinacalcet 60 mg a day.  5. Fosrenol once daily with meals and snacks.  6. Thiamine 100 mg a day.  7. Novolin 70/30, 5 units subcutaneous b.i.d.  8. Simvastatin 40 mg a day.  9. Rena-Vite 1 tablet a day.  10. Sensipar 60 mg a day.  The plan was discussed with the patient, and his two sisters who are with him in the ER, and with Dr. Camillo FlamingKamran Lateef, Hospitalist.   ____________________________ Nathan Junobert T. Elliott, MD rte:cbb D: 07/05/2011 17:55:02 ET T: 07/05/2011 18:20:28 ET JOB#: [id number]  cc: Nathan Junobert T. Elliott, MD, <Dictator> Nathan JunOBERT T ELLIOTT MD ELECTRONICALLY SIGNED 07/09/2011 11:57

## 2014-07-18 NOTE — H&P (Signed)
PATIENT NAME:  Nathan Castillo, [name] MR#:  [id number] DATE OF BIRTH:  May 10, 1950  DATE OF ADMISSION:  08/03/2011  REFERRING PHYSICIAN: ER physician, Dr. Margarita GrizzleWoodruff   PRIMARY CARE PHYSICIAN: Dr. Beverely RisenFozia Khan   PULMONOLOGIST: Dr. Freda MunroSaadat Khan   CARDIOLOGIST: Dr. Juel BurrowMasoud   GASTROENTEROLOGIST: Dr. Bluford Kaufmannh   [facility]    CHIEF COMPLAINT: Failure to thrive.   HISTORY OF PRESENT ILLNESS: The patient is a 64 year old male with past medical history of end-stage renal disease on dialysis Monday, Wednesday, Friday, recently diagnosed colon cancer scheduled for elective partial colectomy by Dr. [name] this coming Tuesday, and multiple other medical problems whose general physical condition has been declining since February. The patient was [facility] 04/11 to 07/10/2011 at which time he was found to have sigmoid colon mass on colonoscopy. Biopsies were taken and the patient was found to have adenocarcinoma. The patient has been cleared for surgery by his cardiologist, Dr. Juel BurrowMasoud, and is scheduled to have elective partial colectomy by Dr. [name] on this coming Tuesday. As per the patient's two sisters at bedside, who are also his health care power-of-attorney, the patient has been living alone. During the past several months he has become progressively weaker and losing weight. When dialysis went to pick him up today, he was found on the floor and he was unable to get up. They are looking for placement for the patient because they are unable to take of him. The patient is constipated but is passing gas. Denies any abdominal pain. He reports some back pain. Denies any nausea or vomiting.   ALLERGIES: No known drug allergies.   CURRENT MEDICATIONS:  1. Lanthanum carbonate 1000 mg with meals and snacks. 2. Cetirizine 10 mg daily.  3. Sensipar 60 mg daily.  4. Nifedipine 90 mg daily.  5. Novolin 70/30 subcutaneously 5 units subcutaneously t.i.d. 6. Simvastatin 40 mg daily. 7. Rena-Vite 1 tablet  daily.  8. Synthroid 50 mcg daily.  9. Thiamine 100 mg daily.  10. Vicodin 5/325 one tablet q.6 hours p.r.n.  11. Omeprazole 20 mg 2 capsules b.i.d.  12. Tylenol p.r.n.   PAST MEDICAL HISTORY:  1. 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 2. End-stage renal disease, on dialysis. 3. Diabetes. 4. Hypertension. 5. Seasonal allergies. 6. Lumbar degenerative joint disease. 7. Cholelithiasis.   8. Pulmonary nodules. 9. Mediastinal and hilar lymphadenopathy. 10. History of bradycardia/arrhythmia status post pacemaker placed by Dr. Juel BurrowMasoud 06/17/2011.  11. Hyperlipidemia.  12. Hypothyroidism.  PAST SURGICAL HISTORY:  1. 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  2. History of permanent pacemaker 06/17/2011 by Dr. Juel BurrowMasoud.   FAMILY HISTORY: Father passed away from complications of heart disease and was diabetic. Mother has history of hypertension and diabetes.   SOCIAL HISTORY: The patient lives alone and is unable to take care of himself. His two sisters are his power-of-attorney. There is no history of smoking, alcohol, or drug abuse.   REVIEW OF SYSTEMS: CONSTITUTIONAL: Reports fatigue, weakness. EYES: Denies any blurred or double vision. ENT: Denies any tinnitus, ear pain. RESPIRATORY: Denies any cough, wheezing. CARDIOVASCULAR: Denies any chest pain or palpitations. GI: Denies any nausea, vomiting, abdominal pain. Is constipated but is passing gas. GU: Denies any dysuria, hematuria. ENDOCRINE: Denies any polyuria, nocturia. HEME/LYMPH: Has history of chronic anemia. Denies any bleeding. INTEGUMENTARY: Denies any acne, rash. MUSCULOSKELETAL: Denies any swelling, gout. Is complaining of low back pain. NEUROLOGICAL: Denies any numbness or weakness. PSYCH: Denies any anxiety  or depression.   PHYSICAL EXAMINATION:   VITAL SIGNS: Temperature 98.1, heart rate 67, respiratory rate 16, blood pressure 166/93, pulse oximetry 100% on room air.    GENERAL: The patient is a 64 year old male laying comfortably in bed not in acute distress.   HEAD: Atraumatic, normocephalic.   EYES: There is pallor. No icterus or cyanosis. Pupils equal, round, and reactive to light and accommodation. Extraocular movements intact.   ENT: Wet mucous membranes. No oropharyngeal erythema or thrush.   NECK: Supple. No masses. No JVD. No thyromegaly or lymphadenopathy.   CHEST WALL: No tenderness to palpation. Not using accessory muscles of respiration. No intracostal muscle retractions.   LUNGS: Bilaterally clear to auscultation. No wheezing, rales, or rhonchi.   CARDIOVASCULAR: S1, S2 regular. No murmur, rubs, or gallops.   ABDOMEN: Soft, nontender, nondistended. No guarding. No rigidity. Normal bowel sounds.   SKIN: No rashes or lesions.   PERIPHERIES: No pedal edema. 1+ pedal pulses.   MUSCULOSKELETAL: No cyanosis or clubbing.   NEUROLOGICAL: Awake, alert, oriented x3. Nonfocal neurological exam. Cranial nerves grossly intact.   PSYCH: Normal mood and affect.   LABORATORY, DIAGNOSTIC, AND RADIOLOGICAL DATA: Abdominal three-way shows nonobstructive bowel gas pattern. White count 7.5, hemoglobin 9.4, hematocrit 30.1, platelets 308. Lipase 76. Glucose 109, BUN 50, creatinine 8.59. Rest of CMP is normal. TSH is 4.89.    ASSESSMENT AND PLAN: This is a 64 year old male with multiple medical problems brought in by family for failure to thrive. 1. Failure to thrive likely related to the patient's multiple medical problems and new diagnosis of colon cancer. The patient lives alone. His family is unable to take care of him. Will admit the patient to the hospital and obtain a PT consultation.  2. Hypothyroidism. The patient's TSH is elevated. Will increase the dose of his Synthroid.  3. End-stage renal disease, on dialysis. The patient is supposed to be dialyzed today, however missed because he was too weak to get up. I have spoken to Dr. Cherylann Ratel. The  patient will be dialyzed today.  4. Recent diagnosis of colon cancer. The patient had a colonoscopy April 2013 and was found to have an obstructing sigmoid colon mass. The pathology has revealed adenocarcinoma. The patient was scheduled for elective surgery by Dr. Michela Pitcher this coming Tuesday. I have spoken to Dr. Michela Pitcher. The patient was cleared for surgery by his cardiologist, Dr. Juel Burrow. Dr. Michela Pitcher wants to wait until after the surgery to involve the oncologist. After the surgery he will have a better idea about the extent of the tumor.  5. Diabetes. Will continue the patient's NovoLog 70/30 and place on insulin sliding scale and ADA diet.  6. Hypertension, overall controlled at present. Will continue the patient's nifedipine.  7. History of seasonal allergies. Will continue the patient's cetirizine.  8. History of hyperlipidemia. Will continue simvastatin. 9. History of gastroesophageal reflux disease. Will continue PPI.   Reviewed old medical records, discussed with the ED physician, discussed with Dr. Cherylann Ratel, discussed with Dr. Michela Pitcher, discussed with the patient and his family the plan of care and management.       TIME SPENT: 75 minutes.  ____________________________ Darrick Meigs, MD sp:drc D: 08/03/2011 11:47:23 ET T: 08/03/2011 12:04:53 ET JOB#: [id number]  cc: Darrick Meigs, MD, <Dictator> Lyndon Code, MD Darrick Meigs MD ELECTRONICALLY SIGNED 08/03/2011 15:12

## 2014-07-18 NOTE — Consult Note (Signed)
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  Treated AVM with argon at setting of 2 and 40.  Also applied one clip.  No oozing at end of procedure.  Will repeat EGD in 8 weeks after PPI therapy to check on GEJ.  Will schedule patient for Monday colonoscopy Dr. Bluford Kaufmannh or me.  Electronic Signatures: Scot JunElliott, Robert T (MD)  (Signed on 12-Apr-13 09:00)  Authored  Last Updated: 12-Apr-13 09:00 by Scot JunElliott, Robert T (MD)

## 2014-07-18 NOTE — Consult Note (Signed)
PATIENT NAME:  Nathan Castillo, CORBIT MR#:  [id number] DATE OF BIRTH:  02-May-1950  DATE OF CONSULTATION:  06/16/2011  REFERRING PHYSICIAN:  Hilda Lias, MD  CONSULTING PHYSICIAN:  Laraya Pestka Lizabeth Leyden, MD  REASON FOR CONSULTATION: Evaluation and management of endstage renal disease in a hemodialysis patient.   HISTORY OF PRESENT ILLNESS: The patient is a very pleasant 64 year old Caucasian male with past medical history of endstage renal disease on hemodialysis Monday, Wednesday, and Friday at [facility] dialysis, followed by [facility] nephrology, hypertension, hyperlipidemia, secondary hyperparathyroidism, and prior history of diabetes mellitus who presented to [facility] with diarrhea. It appeared that the patient was having diarrhea for the past 2 to 3 weeks. It was quite watery in nature. He tried Lomotil and Imodium without significant relief. He began feeling significantly weak as well. Upon evaluation here he was also found to have a slightly low hemoglobin at 10.4. He is currently on enteric precautions at this time. Cardiac enzymes were checked and were negative. The patient reports that he had full dialysis treatment yesterday. In addition, he has secondary hyperparathyroidism for which he takes Fosrenol.   PAST MEDICAL HISTORY:  1. Endstage renal disease on hemodialysis Monday, Wednesday, and Friday at [facility] dialysis followed by [facility] nephrology.  2. Hypertension.  3. Prior history of diabetes mellitus.  4. Hyperlipidemia.  5. Anemia of chronic kidney disease.  6. Secondary hyperparathyroidism.  7. Seasonal allergies.   PAST SURGICAL HISTORY: Left upper extremity arteriovenous fistula, which is aneurysmal.   SOCIAL HISTORY: The patient lives in [place]. He is single. He has no children. He denies tobacco, alcohol, or illicit drug use. He used to work in farming.   FAMILY HISTORY: Mother is apparently still alive and currently lives in a nursing home. The patient's father is  deceased and had history of strokes.   ALLERGIES: No known drug allergies.   CURRENT INPATIENT MEDICATIONS:  1. Tylenol 650 mg p.o. every four hours p.r.n.  2. Sensipar 60 mg p.o. every 24 hours.  3. Insulin 70/30, 5 units subcutaneous b.i.d.  4. Nifedipine 90 mg p.o. daily.  5. Zofran 4 mg IV q. 4 hours p.r.n.  6. Zocor 40 mg p.o. at bedtime.  7. Thiamine 100 mg p.o. daily.  8. Fosrenol 2000 mg p.o. t.i.d. with meals.  REVIEW OF SYSTEMS: CONSTITUTIONAL: Denies fevers, chills, or weight loss. EYES: Denies diplopia or blurry vision. HEENT: Denies headaches or hearing loss. Denies epistaxis or sore throat. PULMONARY: Denies cough, shortness of breath, or hemoptysis. CARDIOVASCULAR: Denies chest pain, palpitations, PND, or orthopnea. GI: Has had some nausea, denies vomiting. Has had significant diarrhea over the past 2 to 3 weeks. NEUROLOGIC: The patient denies focal extremity numbness, weakness, or tingling. MUSCULOSKELETAL: Denies joint pain, swelling, or redness. INTEGUMENT: Denies skin rashes or lesions. ENDOCRINE: Denies polyuria, polydipsia, or polyphagia. Has prior history of diabetes mellitus. HEMATOLOGIC/LYMPHATIC: Denies easy bruisability, bleeding, or swollen lymph nodes. Has history of anemia of chronic kidney disease. PSYCHIATRIC: Denies depression, bipolar disorder. ALLERGY/IMMUNOLOGIC: The patient has history of seasonal allergies.   PHYSICAL EXAMINATION:  VITAL SIGNS: Temperature 98, pulse 61, respirations 16, blood pressure 128/77, pulse oximetry 95% on room air.   GENERAL: Well-developed, well-nourished Caucasian male who appears his stated age, currently in no acute distress.   HEENT: Normocephalic, atraumatic. Extraocular movements are intact. Pupils equal, round, and reactive to light. No scleral icterus. Conjunctivae are pink. No epistaxis noted. Gross hearing intact. Oral mucosa moist.   NECK: Supple without JVD or lymphadenopathy.  LUNGS: Clear to auscultation  bilaterally with normal respiratory effort.   HEART: S1, S2, regular rate and rhythm. No murmurs, rubs, or gallops appreciated.   ABDOMEN: Soft, nontender, nondistended. Bowel sounds positive. No rebound or guarding. No gross organomegaly appreciated.   EXTREMITIES: No clubbing, cyanosis, or edema.   NEUROLOGIC: The patient is alert and oriented to time, person, and place. Strength is five out of five in both upper and lower extremities.   MUSCULOSKELETAL: No joint redness, swelling, or tenderness appreciated.   SKIN: Warm and dry. No rashes noted.   GU: No suprapubic tenderness is noted at this time.   PSYCHIATRIC: The patient has an appropriate affect and appears to have good insight into his current illness.   ACCESS: The patient has an aneurysmal left upper extremity AV fistula in place with good bruit and thrill.   LABORATORY DATA: Sodium 139, potassium 3, chloride 99, CO2 31, BUN 22, creatinine 5.89, glucose 86. Troponin negative times 3 sets. CBC shows WBC 5, hemoglobin 9.8, hematocrit 31, platelets 212   IMPRESSION:  64 year old Caucasian male with past medical history of endstage renal disease on hemodialysis Monday, Wednesday, and Friday, hypertension, hyperlipidemia, secondary hyperparathyroidism, anemia of chronic kidney disease, and aneurysmal left upper extremity AV fistula, who presented to [facility] with diarrhea.  1. Endstage renal disease on hemodialysis Monday, Wednesday, Friday, [facility]: The patient had dialysis yesterday as an outpatient. No acute indication for dialysis at this point in time. If the patient is still here on Monday, we plan to perform hemodialysis. We will use his aneurysmal left upper extremity AV fistula. Dr. Wyn Quakerew has been following this as an outpatient.  2. Anemia of chronic kidney disease: Hemoglobin currently noted as being 9.8. We will resume the patient on Epogen during dialysis.  3. Secondary  hyperparathyroidism: We will check intact PTH and phosphorus during dialysis. For now we will continue Sensipar 60 mg p.o. daily and Fosrenol 2000 mg p.o. t.i.d. with meals.  4. Diarrhea: This is currently in the process of being worked up. There is some suspicion that this could potentially be C. difficile colitis. Stool studies are currently pending.  5. I would like to thank Dr. Cherlynn KaiserSainani for this kind referral. Further plan as the patient progresses.     ____________________________ Lennox PippinsMunsoor N. Chrishauna Mee, MD mnl:bjt D: 06/16/2011 13:17:19 ET T: 06/16/2011 14:02:42 ET JOB#: [id number]  cc: Lennox PippinsMunsoor N. Makhi Muzquiz, MD, <Dictator> Lennox PippinsMUNSOOR N Lerlene Treadwell MD ELECTRONICALLY SIGNED 06/20/2011 0:46

## 2014-07-18 NOTE — Consult Note (Signed)
PATIENT NAME:  Nathan Castillo, Ewin L MR#:  [id number] DATE OF BIRTH:  Mar 12, 1951  DATE OF CONSULTATION:  06/16/2011  REFERRING PHYSICIAN:   CONSULTING PHYSICIAN:  Ezzard StandingPaul Y. Bluford Kaufmannh, MD  REASON FOR REFERRAL: Diarrhea.   HISTORY OF PRESENT ILLNESS: The patient is a 64 year old white male who has had diarrhea for at least 2 to 3 weeks. He describes it as watery. He tried taking Imodium and Lomotil at home without much relief. He denied having any abdominal pain or cramping but because he was feeling weak he came to the Emergency Room where he was found to be bradycardic with a heart rate of only 40's; therefore, the patient was admitted for further evaluation of his heart and GI symptoms. He recently had a CT scan of the abdomen and pelvis which was negative.   Since being admitted he has not had any further diarrhea at all. There is no abdominal pain. There is no gross hematochezia or melena. There is no nausea, vomiting, fevers, or chills.   REVIEW OF SYSTEMS: Same as what was dictated on March 22nd by admission doctor.    PAST MEDICAL HISTORY:  1. End-stage renal disease Monday, Wednesday, Friday. 2. Hypertension.  3. Hyperlipidemia.  4. Hyperparathyroidism.   ALLERGIES: He has no known drug allergies.  SOCIAL HISTORY: Does not smoke or drink.   FAMILY HISTORY: Notable for heart disease, diabetes, and high blood pressure.    MEDICATIONS AT HOME:  1. Zocor. 2. Tylenol. 3. Baby aspirin. 4. Nifedipine. 5. Insulin. 6. Thiamine.   PHYSICAL EXAMINATION:   GENERAL: The patient appears to be in no acute distress.   VITAL SIGNS: He was afebrile. Vital signs were completely stable. Oxygenation was 94% on room air.   HEENT: Normocephalic, atraumatic head. Pupils are equally reactive. Throat was clear.   NECK: Supple.   CARDIAC: Somewhat bradycardic rhythm but there were no murmurs or clicks.   LUNGS: Clear bilaterally.   ABDOMEN: Normoactive bowel sounds, soft, nontender throughout. There is  evidence of enlarging left inguinal hernia that has not been operated on.   EXTREMITIES: Evidence of an AV fistula in his left arm, otherwise, there is no edema.   LABORATORY, DIAGNOSTIC, AND RADIOLOGICAL DATA: Sodium 139, potassium 3.0, chloride 99, CO2 31, BUN 22, creatinine 5.89. Liver enzymes normal. CPK enzymes and troponin levels were normal. White count 5.0, hemoglobin 9.8, platelet count 212, INR 1.1.   ASSESSMENT AND PLAN: This is a patient with a two week history of diarrhea and bradycardia. He is already on telemetry. He has not had any further diarrhea since admission. He may have had some infectious process but this appears to be resolving on its own. If he can give stool samples then we should check O and P cultures and C. difficile. If there is no further diarrhea, then no further work-up needs to be done at this time. Because of his age, he should have a screening colonoscopy done. Because of his hernia, it may be difficult to do the colonoscopy unless the hernia is fixed first.       Thank you for the referral.   ____________________________ Ezzard StandingPaul Y. Bluford Kaufmannh, MD pyo:drc D: 06/17/2011 10:47:56 ET T: 06/17/2011 11:29:31 ET JOB#: [id number]  cc: Ezzard StandingPaul Y. Bluford Kaufmannh, MD, <Dictator> Wallace CullensPAUL Y Amrom Ore MD ELECTRONICALLY SIGNED 06/17/2011 14:37

## 2014-07-18 NOTE — Discharge Summary (Signed)
PATIENT NAME:  Nathan Castillo, Nathan Castillo MR#:  [id number] DATE OF BIRTH:  July 06, 1950  DATE OF ADMISSION:  07/05/2011 DATE OF DISCHARGE:  07/10/2011  PRIMARY CARE PHYSICIAN: Beverely Risen, MD  CONSULTANTS:  1. Mosetta Pigeon, MD - Nephrology.  2. Lynnae Prude, MD - Gastroenterology. 3. Lutricia Feil, MD - Gastroenterology.  4. Lamont Dowdy, MD - Nephrology.  5. [place] Cherylann Ratel, MD - Nephrology. 6. Tiney Rouge, MD - Surgery.   CHIEF COMPLAINT: I feel weak.  DISCHARGE DIAGNOSES:  1. 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  2. Sigmoid mass, possibly cancerous.  3. End-stage renal disease, on dialysis.  4. Diabetes.  5. Hypertension.   SECONDARY DIAGNOSES:  1. Anemia of chronic disease. 2. Seasonal allergies. 3. Lumbar degenerative joint disease.  4. Cholelithiasis. 5. History of pulmonary nodules. 6. History of mediastinal and hilar lymphadenopathy noted on chest CT 06/06/2011. 7. History of bradycardia/arrhythmia status post permanent pacemaker by Dr. Corky Downs on 06/17/2011.      DISCHARGE MEDICATIONS:  1. Nifedipine 90 mg extended-release daily.  2. Cetirizine 10 mg daily.  3. Cinacalcet 60 mg daily.  4. Lanthanum carbonate 1000 mg chewable 1 tablet with meals and snacks. 5. Thiamine 100 mg once a day. 6. Novolin 70/30, 5 units twice a day.  7. Simvastatin 40 mg daily.  8. Rena-Vite 1 tab daily.  9. Levothyroxine 50 mcg daily.  10. Protonix 40 mg p.o. twice a day.  DIET: Low sodium, ADA diabetic diet.   ACTIVITY: As tolerated.   DISCHARGE FOLLOWUP/INSTRUCTIONS: Please follow-up with your primary care physician and GI physician for biopsy results of the sigmoid mass, as well as surgery, Dr. Michela Pitcher, within 1 to 2 weeks. Please go to your dialysis center as before.   DISPOSITION: Home   HISTORY OF PRESENT ILLNESS: Please see the full History and Physical dictated on 07/05/2011 Dr. Cherylann Ratel, but briefly this is a 64 year old male with  end-stage renal disease, diabetes, and hypertension who presented with anemia. He was found to have a hemoglobin of 7.5 where as on previous discharge, on 06/18/2011, it was 9.7. He had tarry stools and was admitted to the hospitalist service for further evaluation and management.   SIGNIFICANT LABS: Initial creatinine was 6.87, BUN 23, glucose 152, and albumin 3.2. TSH 8. Hemoglobin initially was 7.5, then 8.1, and status post 3 units of PRBC last hemoglobin of 10.1, and platelets 237.   HOSPITAL COURSE: The patient was admitted to the hospitalist service and started on clears and gastroenterology was consulted. He had a colonoscopy scheduled as an outpatient. He was seen by Dr. Mechele Collin and underwent upper endoscopy on 07/06/2011. The patient was found to have a single nonbleeding angiectasia in the stomach. There was nodular mucosa in the gastric antrum. The AVM was clipped. There has been no further bleed. Another EGD is recommended with PPI two times a day. EGD is recommended in about 6 to 8 weeks. He was then followed up by Dr. Bluford Kaufmann and underwent colonoscopy. The colonoscopy on 07/09/2011 did show multiple small and large mouth diverticula and some polyps. The polyps were removed. There was also an ulcerated nonobstructive medium size mass found in the sigmoid colon which was oozing. Biopsies were taken. It is possible that this is cancerous. Surgical consult was recommended and the patient was seen by Dr. Michela Pitcher from surgery. Resection of the mass is recommended, however, at this point the biopsy results are not back and the patient has not made up his mind of  what to do. He did have transfusion of 2 units PRBC and his hemoglobin has been responding appropriately and is on an up-trend overall. He is not to have any NSAIDs or aspirin. As he is undecided about follow up and surgical resection of the sigmoid mass, he is to follow up with surgery and gastroenterology as an outpatient. We will attempt to make  follow-up appointments for him. He is also to follow-up with his dialysis schedule of Monday, Wednesday, and Friday. He has no further bleeding. He is hemodynamically stable and he will be discharged.   DISPOSITION: Home.   CODE STATUS: FULL CODE.   TOTAL TIME SPENT: 35 minutes  ____________________________ Krystal EatonShayiq Wong Steadham, MD sa:slb D: 07/10/2011 13:49:52 ET T: 07/11/2011 11:54:16 ET JOB#: [id number]  cc: Krystal EatonShayiq Madailein Londo, MD, <Dictator> Lyndon CodeFozia M. Khan, MD Marcelle SmilingSHAYIQ [facility] HospitalHMADZIA MD ELECTRONICALLY SIGNED 07/13/2011 12:44

## 2014-07-18 NOTE — Consult Note (Signed)
Colonoscopy showed colon polyps, left sided tics, and medium sized mass in sigmoid colon. Multiple bx taken. Most likely malignant. Recommend surgical referral. Clear liquid diet ordered. I will be out tomorrow but will check back on Wed. Thanks.  Electronic Signatures: Lutricia Feilh, Erlean Mealor (MD)  (Signed on 15-Apr-13 15:00)  Authored  Last Updated: 15-Apr-13 15:00 by Lutricia Feilh, Jamirra Curnow (MD)

## 2014-07-18 NOTE — Consult Note (Signed)
Chief Complaint:   Subjective/Chief Complaint No complaints. No acute bleeding.   VITAL SIGNS/ANCILLARY NOTES: **Vital Signs.:   14-Apr-13 05:14   Vital Signs Type Routine   Temperature Temperature (F) 98.9   Celsius 37.1   Temperature Source oral   Pulse Pulse 64   Pulse source per Dinamap   Respirations Respirations 19   Systolic BP Systolic BP 138   Diastolic BP (mmHg) Diastolic BP (mmHg) 74   Mean BP 95   BP Source Dinamap   Pulse Ox % Pulse Ox % 98   Pulse Ox Activity Level  At rest   Oxygen Delivery 3L    06:15   Pulse Pulse 81   Pulse source per Telemetry Clerk   Telemetry pattern Cardiac Rhythm Paced rhythm; pattern reported by Telemetry Clerk; vent paced   Brief Assessment:   Cardiac Regular    Respiratory clear BS    Gastrointestinal Normal   Assessment/Plan:  Assessment/Plan:   Assessment GI bleeding. Gastric AVM.    Plan Bowel prep this afternoon. Dialysis in AM and colonosopy in PM. Thanks.   Electronic Signatures: Lutricia Feilh, Arianny Pun (MD)  (Signed 14-Apr-13 09:14)  Authored: Chief Complaint, VITAL SIGNS/ANCILLARY NOTES, Brief Assessment, Assessment/Plan   Last Updated: 14-Apr-13 09:14 by Lutricia Feilh, Stela Iwasaki (MD)

## 2014-07-18 NOTE — Op Note (Signed)
PATIENT NAME:  Nathan Castillo, Nathan Castillo MR#:  [id number] DATE OF BIRTH:  11/02/1950  DATE OF PROCEDURE:  08/07/2011  PREOPERATIVE DIAGNOSIS: Sigmoid colon carcinoma.   POSTOPERATIVE DIAGNOSIS: Sigmoid colon carcinoma.   OPERATION: Laparoscopy with open sigmoid resection.   ANESTHESIA: General.   SURGEON: Quentin Orealph Castillo. Ely, III, MD    ASSISTANT: Natale LayMark Bird, MD   OPERATIVE PROCEDURE: With the patient in the supine position after induction of appropriate general anesthesia, the patient was placed in lithotomy position, appropriately padded and positioned. Abdomen was prepped with Betadine and draped with sterile towels. Alcohol wipe Betadine impregnated Steri-Drape were utilized. The patient was placed in the head down, feet up position. Small supraumbilical incision was made in the standard fashion, carried down bluntly through the subcutaneous tissue. The Veress needle was used to cannulate the peritoneal cavity. CO2 was insufflated to appropriate pressure measurements. When approximately 2.5 liters of CO2 were instilled, the Veress needle was withdrawn. An 11 mm Applied medical port was inserted in the peritoneal cavity. Intraperitoneal position was confirmed. CO2 was reinsufflated. The abdomen was then visualized. Intraperitoneal position was confirmed. A right lower quadrant transverse incision was made and an 11 mm port was inserted under direct vision. There appeared to be a large left inguinal hernia and a large amount of sigmoid colon in the hernia defect. It was reduced. Careful visualization from the mid descending colon to the proximal rectum did not identify any obvious lesion. The lesion was large but had not been inked. For that reason laparoscopy was abandoned. A midline incision was made from the supraumbilical area to the suprapubic area and carried down through the subcutaneous tissue with Bovie electrocautery. Midline fascia was identified and opened in line with the skin incision as was the  peritoneum. The abdomen was then examined and the lesion was identified without difficulty. It was marked. The bowel was divided on either side using a GIA-75 stapling device and the mesentery was taken down with the LigaSure apparatus. The bowel was opened and specimen confirmed. The distal bowel was cleaned and a purse-string clamp placed across the bowel. A 2-0 nylon purse-string suture placed through the purse-string clamp. It was sized to an EEA 29 stapling device. A proximal colotomy was made longitudinally and the body of the EEA stapler inserted through the proximal colotomy. The spike was driven out through the staple line, married to the stapler, approximated, and fired. There did not appear to be any defects in the anastomosis by palpation. The rings were intact. The colotomy was closed with transverse application of TA-60 stapling device. Mesenteric defect was closed with 3-0 Vicryl. The anastomosis was reinforced with seromuscular sutures of 3-0 silk. Vicryl suture was then used to close appendices epiploica over the proximal colotomy. Bowel contents were returned to their anatomic position. The area was copiously irrigated with warm saline solution. Seprafilm was placed over the omentum. Midline fascia was closed with running suture of loop 0 PDS suture. Skin was clipped. A central line was then placed in the right common femoral vein using Seldinger technique. Sterile dressings were applied. The patient was returned to the recovery room having tolerated the procedure well. Sponge, instrument, and needle counts were correct x2 in the operating room.   ____________________________ Carmie Endalph Castillo. Ely III, MD rle:drc D: 08/07/2011 12:16:22 ET T: 08/07/2011 12:37:19 ET JOB#: [id number]  cc: Carmie Endalph Castillo. Ely III, MD, <Dictator> Ezzard StandingPaul Y. Bluford Kaufmannh, MD Lyndon CodeFozia M. Khan, MD Quentin OreALPH Castillo ELY MD ELECTRONICALLY SIGNED 08/08/2011 16:40

## 2014-07-18 NOTE — Consult Note (Signed)
Dr. Bluford Kaufmannh will see over weekend and plan a colonoscopy for Monday if all goes well.  Electronic Signatures: Scot JunElliott, Robert T (MD)  (Signed on 12-Apr-13 15:43)  Authored  Last Updated: 12-Apr-13 15:43 by Scot JunElliott, Robert T (MD)

## 2014-07-18 NOTE — Consult Note (Signed)
Pt not in room. Getting pacemaker placed. According to nurse, no BM since admission. Will sign off. Have patient f/u with us later as outpt to discuss scheduling screening colonoscopy. If condition changes in hospital, call us back. Thanks.  Electronic Signatures: Lutricia Feilh, Aristide Waggle (MD)  (Signed on 24-Mar-13 12:22)  Authored  Last Updated: 24-Mar-13 12:22 by Lutricia Feilh, Amelia Macken (MD)

## 2014-07-18 NOTE — Op Note (Signed)
PATIENT NAME:  Nathan Castillo, Nathan Castillo MR#:  [id number] DATE OF BIRTH:  1950/05/20  DATE OF PROCEDURE:  06/17/2011  PRIMARY CARE PHYSICIAN: Clayborn Bigness, MD   CARDIOLOGIST/IMPLANTING PHYSICIAN: Cletis Athens, MD.   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  PROCEDURE: Insertion of a permanent pacemaker.   DESCRIPTION OF PROCEDURE: The patient was taken to the Operating Room, and after informed consent was obtained he was prepared in a sterile fashion. The right upper chest was draped for the insertion of a permanent pacemaker because he has a dialysis fistula in the left arm. After preparing the patient, the right side was prepared by injecting 1% Xylocaine in the skin and subcutaneous tissue; and an incision was made in the skin overlying the deltopectoral groove. Subcutaneous tissue and skin was dissected out. The cephalic vein could not be isolated because it was too deep, so a right subclavian stick was made. The patient had to be stuck about three times to get a good entry through the subclavian vein, and three guidewires had to be changed to enter into the superior vena cava. Finally, one guidewire went into the right atrium and then to that the introducer kit and pacemaker lead was introduced through the introducer kit; and optimum position was obtained at the floor of the right ventricle. The pacemaker selected for this patient was made by [employer]. It is a single chamber VVIR pacemaker 5092, 52 cm. The pacemaker model is [employer]pta [id number], serial number [id number]. This is a single lead pacemaker. The atrial lead was not put in because of difficulty in putting the hardware through the right subclavian vein into the right atrium. A small amount of contrast was also used, about 0.5 to 1 mL, to determine the position of the lead during the procedure also. After the lead was positioned and tied to the pectoralis major muscle, it was checked  twice and all stimulation thresholds were found to be satisfactory. A pacemaker cavity was fashioned under the superficial fascia, and the pacemaker was connected with the lead. All connections were tightened securely. Subcutaneous and skin were sutured separately.     The patient was returned to the recovery room in a stable condition. The family was notified and apprised of the patient's condition. A chest x-ray will be obtained in the recovery room.   ____________________________ Cletis Athens, MD jm:cbb D: 06/17/2011 12:06:22 ET T: 06/17/2011 14:51:00 ET JOB#: [id number]  cc: Cletis Athens, MD, <Dictator> Lavera Guise, MD Cletis Athens MD ELECTRONICALLY SIGNED 06/17/2011 19:59

## 2014-07-18 NOTE — Consult Note (Signed)
PATIENT NAME:  Nathan Castillo, Nathan Castillo MR#:  [id number] DATE OF BIRTH:  Jan 08, 1951  DATE OF CONSULTATION:  07/09/2011  REFERRING PHYSICIAN:   CONSULTING PHYSICIAN:  Carmie End, MD  PRIMARY CARE PHYSICIAN:  [facility], Dr. Beverely Risen  PRIMARY CARDIOLOGIST: Dr. Juel Burrow   CHIEF COMPLAINT: Dizziness and weakness.   BRIEF HISTORY: Mr. Nathan Castillo is a 64 year old gentleman admitted through the Emergency Room with guaiac-positive stools and generalized weakness. He is a longstanding dialysis patient who is on a Monday, Wednesday, Friday dialysis schedule. He was hospitalized recently with guaiac-positive stools and diarrhea. He was felt to possibly have a viral gastroenteritis at the time. He was also noted to have significant generalized weakness. Dr. Juel Burrow placed a pacemaker as he continued to have symptomatic bradycardia.  The patient has done well with his pacemaker, but noticed that he continued to have lower gastrointestinal bleeding and weakness. His hemoglobin at the time of his evaluation on his recent follow-up visit was 7.5 down from 9.7 at the time of his evaluation for his pacemaker placement. He was admitted the hospital for transfusion, dialysis, and further evaluation. He underwent upper endoscopy on the thirteenth which was largely unremarkable. He did have telangiectasia, which was treated by the gastroenterologist. He underwent colonoscopy today, which demonstrated multiple small polyps and a moderate size, nonobstructing sigmoid mass which appeared to be a malignancy. The surgical service was consulted.   The patient denies any other significant GI problems. Specifically he denies any history of hepatitis, yellow jaundice, pancreatitis, peptic ulcer disease, gallbladder disease, or diverticulitis. He does have a longstanding history of endstage renal disease treated on a regular basis with dialysis through the [facility] nephrology group. He has a history of bradyarrhythmias and a 2 to 1 AV  block which prompted a permanent pacemaker placement by Dr. Juel Burrow.  He is also hypertensive and hyperlipidemic. He has no previous abdominal surgery. He is dialyzed through a left forearm fistula.   ALLERGIES: He has no medical allergies.     MEDICATIONS: Noted in his admission note and include: 1. Aspirin 81 mg p.o. daily.  2. Cetirizine 10 mg p.o. daily.  3. Lithium carbonate 100 mg p.o. t.i.d.  4. Levothyroxine 50 mcg daily.  5. Nifedipine 90 mg p.o. daily.  6. Novolin insulin. 7. Zocor 40 mg p.o. daily.  8. Thiamine 100 mg p.o. daily.   REVIEW OF SYSTEMS: Difficult because the patient has been sedated but they are reviewed from his admission note and I see no other abnormalities.   PHYSICAL EXAMINATION:  GENERAL:  He is an alert, pleasant gentleman who is a bit sedated but responsive. He is oriented reasonably well.   VITAL SIGNS: Blood pressure 168/88, heart rate is 92 and regular.   HEENT: Unremarkable. Pupils were equally round with no scleral icterus and he has no facial deformities.   NECK: Supple without adenopathy. His trachea is midline and I cannot palpate his thyroid gland.   CHEST: Clear with very distant breath sounds and he appears to have normal pulmonary excursion. He has no adventitious sounds my examination.   CARDIAC: No murmurs or gallops. He appears to have normal rhythm at this point.   ABDOMEN: Soft with no abdominal tenderness. Normal bowel sounds. No masses, hernias, rebound, or guarding.   EXTREMITIES: Lower extremity exam reveals full range of motion, no deformities.  He has good distal pulses. He has a left forearm AV fistula.    PSYCHIATRIC: Normal orientation and slightly depressed affect from his  sedation.    ASSESSMENT:  I reviewed his colonoscopy room reports and pictures. At the present time this lesion does appear to be a sigmoid malignancy, likely the source of his bleeding. I talked with him about the surgical options. Whether or not this  lesion is malignant, it should be considered for resection because of his bleeding problem and chronic medical problems. He will need risk assessment by the medical physicians and his nephrologist. He would like to wait until the pathology is available for making a decision regarding surgery.  His primary physicians have told him he can be discharged tomorrow after he recovers from his colonoscopy. We are available to assist in his management as necessary. We can operate on him as early as this week or certainly see him back in our office for an outpatient evaluation with the plan of pursuing surgery at another time.  I appreciate the opportunity to be of service in his care and appreciate the consultation.      ____________________________ Carmie Endalph L. Ely III, MD rle:bjt D: 07/09/2011 18:24:36 ET T: 07/10/2011 09:51:21 ET JOB#: [id number]  cc: Quentin Orealph L. Ely III, MD, <Dictator> Quentin OreALPH L ELY MD ELECTRONICALLY SIGNED 07/11/2011 8:46

## 2014-07-18 NOTE — Consult Note (Signed)
Chief Complaint:   Subjective/Chief Complaint doing about the same. CT chest reviewed showing stable appearing nodes and small nodules. Cardiac chambers are larger   VITAL SIGNS/ANCILLARY NOTES: **Vital Signs.:   26-Mar-13 09:37   Vital Signs Type Routine   Temperature Temperature (F) 98.4   Celsius 36.8   Temperature Source oral   Pulse Pulse 73   Pulse source per Dinamap   Respirations Respirations 18   Systolic BP Systolic BP 155   Diastolic BP (mmHg) Diastolic BP (mmHg) 81   Mean BP 105   BP Source Dinamap   Pulse Ox % Pulse Ox % 92   Pulse Ox Activity Level  At rest   Oxygen Delivery Nasal Cannula; 1.5L  *Intake and Output.:   Daily 26-Mar-13 07:00   Grand Totals Intake:  287 Output:  1500    Net:  -1213 24 Hr.:  -1213   Oral Intake      In:  237   IV (Secondary)      In:  50   Dialysis Fluid Removed (ml) ml     Out:  1500   Length of Stay Totals Intake:  794 Output:  1500    Net:  -706   Brief Assessment:   Cardiac Regular  -- LE edema  --Gallop    Respiratory normal resp effort  clear BS  no use of accessory muscles    Gastrointestinal details normal Soft  Nontender  No rigidity   Cardiology:  25-Mar-13 07:28    Ventricular Rate 68   Atrial Rate 68   QRS Duration 132   QT 470   QTc 499   R Axis -72   P-R Interval 294   P Axis 96   T Axis 32   Radiology Results: CT:    24-Mar-13 15:51, CT Chest With Contrast   CT Chest With Contrast    REASON FOR EXAM:    nodules and adeopathy pt to have dialysis in am  COMMENTS:       PROCEDURE: CT  - CT CHEST WITH CONTRAST  - Jun 17 2011  3:51PM     RESULT: Axial CT scanning was performed through the chest with   reconstructions at 3 mm intervalsand slice thicknesses following   intravenous administration of 75 cc of Isovue-370. Comparison is made to   the study of June 15, 2010. Review of multiplanar reconstructed images   was performed separately on the VIA monitor.    At lung window settings there is  stable nodularity in the   anterior/inferior aspect of the right upper lobe seen on image 46. Subtle   density on image 49 adjacent to the minor fissure is stable. On image 52   an approximately 2 x 3 mm diameter density in a subpleural location     posteriorly in the right lower lobe is also stable. Subtle nodularity   posteriorly in the left lower lobe on image 71 measuring approximately 3   mm in diameter is stable. Other areas of nodularity seen in the left   lower lobe on the previous study are obscured by increased pleural   thickening and small left pleural effusion which developed since the   prior study. There are small amounts of pleural calcification visible on   today's study as well in both costophrenic gutters. Elsewhere the   pulmonary parenchyma demonstrates mildly increased interstitial density   suggesting interstitial edema.     The cardiac chambers are enlarged. The caliber of the thoracic  aorta is   normal. Contrast within the central pulmonary vascular tree is normal.   There is a stable lymph node in the AP window anteriorly on image 33   which measures approximately 1.7 cm in greatest dimension. More   posteriorly in the AP window there are prominent but stable appearing     lymph nodes. There is asubcarinal lymph node which is stable measuring   1.9 cm in long axis seen best on image 43. There are additional lymph   nodes posterior to the right pulmonary venous confluence which have not   dramatically changed since the earlier study and measure 2.4 cm in   aggregate in long axis. There are borderline enlarged hilar lymph nodes   which are demonstrated to better advantage than on the earlier study. The   largest is seen on image 50 in the left hilum and measures 1.8 cm in long   axis. The presence of IV contrast on today's study levels better   evaluation of the hilar structures.     Within the upper abdomen the liver exhibits decreased density consistent    with fatty infiltration. There is a ring calcified gallstone which appear   stable. Multiple cystic structures are associated with both kidneys. The   observed portions of the pancreas and spleen and adrenal glands are   normal.  IMPRESSION:   1. There is a stable appearance of pulmonary parenchymal nodularity.  2. There are stable appearing mediastinal and hilar lymph nodes.  3. There is a new small left pleural effusion. There is increased   interstitial density in both lungs consistent with pulmonary interstitial   edema.  4. The cardiac chambers are enlarged and appear to have increased since   the previous study.  5. Again demonstrated is a gallstone near the gallbladder neck.          Verified By: DAVID A. SwazilandJORDAN, M.D., MD   Assessment/Plan:  Assessment/Plan:   Assessment 1. Pulmonary Nodules CT scan reviewed multiple nodules no large mass noted. Will follow up in office and monitor progression   Electronic Signatures: Yevonne PaxKhan, Keela Rubert A (MD)  (Signed 26-Mar-13 12:29)  Authored: Chief Complaint, VITAL SIGNS/ANCILLARY NOTES, Brief Assessment, Lab Results, Radiology Results, Assessment/Plan   Last Updated: 26-Mar-13 12:29 by Yevonne PaxKhan, Raymona Boss A (MD)

## 2014-07-18 NOTE — Consult Note (Signed)
PATIENT NAME:  Nathan Castillo, Nathan Castillo DATE OF BIRTH:  06/05/1950  DATE OF CONSULTATION:  08/18/2011  REFERRING PHYSICIAN:   CONSULTING PHYSICIAN:  Nathan Castillo, Nathan Castillo  HISTORY OF PRESENT ILLNESS: Nathan Castillo is a 64 year old patient with a significant history of end-stage renal disease on dialysis who underwent colonoscopy during an April admission and was found to have a sigmoid colon mass and colonoscopy biopsy positive for adenocarcinoma. 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 He had a colonoscopy that showed polyps plus an oozing sigmoid mass. He had transfusion of 2 units of packed red blood cells prior to discharge. He was readmitted May 10th. He underwent surgery after clearance by Cardiology, Dr. Juel BurrowMasoud. Pathology is back on the chart from recent surgery on May 14th. The patient has been recovering from surgery and complications included some surgical wound infection that was drained. Antibiotic treatment was continued. The patient is improving with respect to wound issues. The patient has also undergone recently CT scanning without contrast of chest and abdomen. There were findings of some hilar adenopathy, subcarinal and parenchymal lung nodules, and also some hypodensities of the liver that can't be further characterized.   PAST MEDICAL HISTORY:  1. Blood loss anemia.  2. End-stage renal disease, on dialysis. Had been receiving Procrit but it's now held with the malignant diagnosis.  3. Hypertension.  4. Diabetes. 5. Degenerative joint disease. 6. Cholelithiasis.  7. The pulmonary nodules as noted look to be reasonable finding as is the mediastinal hilar adenopathy.  8. Bradyarrhythmia and has had a pacemaker placed in March.  9. Hyperlipidemia.  10. Hypothyroid.   11. Left upper extremity AV fistula.   FAMILY  HISTORY: Noncontributory to the point of view of malignancy.   SOCIAL HISTORY: The patient had lived alone but with declining condition he could no longer do self-care. His sister is his power-of-attorney. There is no alcohol or smoking history.  SYSTEM REVIEW: The patient had significant weakness. No headache or dizziness or visual disturbances. No ear or jaw pain. No cough or wheezing. He has general weakness. No palpitations or retrosternal chest pain. No abdominal pain, nausea, or vomiting except for incisional discomfort. He had not prior had any urine or dysuria or hematuria. No prostate problems. No rash, bruising, or significant edema. No history of depression. He was slightly anxious.   PHYSICAL EXAMINATION:   GENERAL: He was alert and cooperative with pallor. No acute distress. He looks subacutely and chronically ill. He is on oxygen.   MOUTH: No thrush.   LYMPH: No palpable lymph nodes in the neck, supraclavicular, submandibular, or axilla.   ABDOMEN: Nontender. The dressing was intact.   EXTREMITIES: No edema.   SKIN: No rashes.  NEUROLOGIC: Alert, cooperative, and oriented.   LABORATORY, DIAGNOSTIC, AND RADIOLOGICAL DATA: The creatinine on May 25th was 5.9. Potassium was 3.9. He had liver chemistries prior documented back earlier in the admission, May 10th. Liver chemistries are normal. There is no CEA documented. As already eluded to, CT scan without IV contrast of chest, abdomen have revealed some abnormalities in the liver, possibly malignant but uncharacterized and pulmonary parenchymal nodules as well as hilar and subcarinal adenopathy that could be malignant or inflammatory.   IMPRESSION AND PLAN: The patient continues wound care and medical treatment of hypoxia. He has a regular dialysis schedule.  I've discussed his ongoing treatment potential with Nephrology. It is unclear if the patient has metastatic disease at this time or if he has a complete resection and, therefore,  he would be an adjuvant setting. His pathology is worrying with only three lymph nodes removed involved with cancer, a poorly differentiated tumor with some squamous and mucinous features but an adenocarcinoma by final report, but tumor that had extended into pericolonic fat so a T3 and N1B MX currently, at least a stage III cancer. Resection margins were negative. Note on the original sigmoidoscopy the tumor was mild to moderately well differentiated but the final path as above.   PLAN: Will document a CEA and will see if there was a preop CEA done as an outpatient and then further comparison. When the patient is recovered from surgery and is up and mobile, he can have a PET scan. It could be now or would be okay to delay that a few weeks. I will see the patient about 2 to 3 weeks postoperatively for the purpose of reviewing the PET scan and then determining based on that scan if the patient needs a biopsy of any lung or liver lesions to establish if he has metastatic disease and will follow clinically. He is a candidate for treatment for metastatic or adjuvant cancer but particular attention to his dialysis and the doses and timing of drugs would, of course, be adjusted for dialysis. The patient understands the current issues.   ____________________________ Nathan Castillo [name], Nathan Castillo rgg:drc D: 08/19/2011 11:49:25 ET T: 08/19/2011 12:44:25 ET JOB#: [id number]  cc: Nathan Castillo. [name], Nathan Castillo, <Dictator> Nathan Castillo Nathan Castillo ELECTRONICALLY SIGNED 08/23/2011 10:52

## 2014-07-18 NOTE — Discharge Summary (Signed)
PATIENT NAME:  Nathan Castillo, Nathan Castillo MR#:  [id number] DATE OF BIRTH:  07/06/1950  DATE OF ADMISSION:  08/03/2011 DATE OF DISCHARGE:  08/22/2011  NOTE: You will need to refer to the interim discharge summary already dictated by Dr. Katharina Caperima Vaickute on 08/17/2011. This will summarize the hospitalization from 05/24 through 08/22/2011.  ADMISSION DIAGNOSIS: Acute respiratory failure.   DISCHARGE DIAGNOSES:  1. Acute respiratory failure secondary to pulmonary edema.  2. Sigmoid high-grade invasive adenocarcinoma.  3. End-stage renal disease, on hemodialysis.  4. Anemia of chronic disease.  5. Hypertension.  6. Diabetes.  7. Malnutrition.  8. Surgical wound infection, Escherichia coli.   HISTORY OF PRESENT ILLNESS: The patient is a 64 year old male with end-stage renal disease on hemodialysis who was admitted to the hospital for failure to thrive. He was found to have sigmoid colon carcinoma. He is status post colectomy. For further details, refer to the history and physical and the interim summary by Dr. Katharina Caperima Vaickute.  1. Acute respiratory failure secondary to pulmonary edema. The patient has been receiving hemodialysis. His echo showed normal LV function with left ventricular hypertrophy and RV overload. The patient is doing better. He will need to be discharged with some oxygen.  2. Sigmoid high-grade invasive adenocarcinoma, status post colectomy on 08/07/2011. Pathology came back as undifferentiated cancer. CEA is on the higher side. He is on a fentanyl patch for pain. He will need to followup with Dr. Lorre NickGittin in two weeks. At that time Dr. Lorre NickGittin may order a PET scan.  3. End stage renal disease, on hemodialysis. Appreciate nephrology consult.  4. Anemia of chronic disease. His hemoglobin has been between 7 and 8.  He had iron studies consistent with chronic disease. He will receive 1 unit of packed red blood cells prior to discharge. He has already received two. He did have an EGD on 07/06/2011 with  esophagitis and angiectasia.  5. Hypertension. On Adalat.  6. Diabetes. Seems to be stable at this time.  7. Surgical wound infection, positive for Escherichia coli. The patient will be on Keflex. He has some mild dehiscence.   DISCHARGE MEDICATIONS:  1. Lanthanum carbonate 1000 mg daily. 2. Cetirizine 10 mg daily.  3. Cinacalcet 60 mg daily.  4. Novolin 70/30, 5 units twice a day. 5. Simvastatin 40 mg daily.  6. Rena-Vite 1 tablet daily.  7. Thiamine 100 mg daily.  8. Omeprazole 20 mg two tablets twice a day. 9. Tylenol extra strength 1 tablet every six hours p.r.n.  10. Adalat 60 mg daily.  11. Synthroid 0.075 mg daily.  12. Fentanyl patch 12 mcg every 72 hours.  13. Keflex 500 mg every 12 hours for 12 days.  OXYGEN: 2 liters nasal cannula.   DISCHARGE DIET: Low sodium, ADA, renal diet.   DISCHARGE ACTIVITY: As tolerated.  DISCHARGE FOLLOWUP: Followup with Dr. [name] on 08/28/2011 at 9:30, Dr. Cherylann RatelLateef on 08/24/2011 at 9:30, and Dr. Lorre NickGittin on 09/05/2011 at 3:00.   TIME SPENT: 35 minutes.  ____________________________ Janyth ContesSital P. Juliene PinaMody, MD spm:slb D: 08/22/2011 14:40:00 ET     T: 08/22/2011 15:41:59 ET         JOB#: [id number] cc: Knute Neuobert G. Lorre NickGittin, MD Carmie Endalph Castillo. Ely III, MD Munsoor Lizabeth LeydenN. Lateef, MD Janyth ContesSITAL P Bryant Lipps MD ELECTRONICALLY SIGNED 08/31/2011 12:40

## 2014-07-18 NOTE — Consult Note (Signed)
Brief Consult Note: Diagnosis: SIMOID COLON CANCER T3 N1, 3 NODES POSITIVE, POORLY DIFFERENTIATED.   Patient was seen by consultant.   Consult note dictated.   Comments: SEE DICTATED NOTE. STAGE 3 POORLY DIFFERENTIATED CANCER. ESRD, ANGIOECTASIA STOMACH, HTN, HYPERLIPIDEMIA,  ABNORMALITIES ON CT INCLUDE ADENOPATHY HILAR AND SUBCARINAL, LUNG NODULES, BNENIGN VS MALIGNANT TO BE DETERMINED,  ALSO HYPODENSITIES OF LIVER . PATIENT DEBILITATED, ALERT, COOPERATIVE, UNDERSTANDS DIAGNOSIS. RECOVERED FROM WOUND INFECTION. GETTING PHYS TX. ESRD AND CHRONIC ANEMIA. LFT OK. NO CEA DOCUMENTED.  PLAN CHECK CEA, CHECK IF OUT PATIENT CEA DOCUMENTED. AFTER RECOVERY FROM SURGERY, REEVALUATE IN APPROX 3 WEEKS, WITH  PET/CT, WILL ALSO F/U WITH GENETIC MARKERS TO FURTHUR CHARACTERIZE TUMOR AND DIRECT TX CHOICES, FOR POSSIBLY METASTATIC, HOPEFULLY ADJUVANT  SETTING.  Electronic Signatures: Marin RobertsGittin, Lavance Beazer G (MD)  (Signed [phone]-May-13 14:24)  Authored: Brief Consult Note   Last Updated: 25-May-13 14:24 by Marin RobertsGittin, Caleb Decock G (MD)

## 2014-07-18 NOTE — Consult Note (Signed)
Full consult to follow. Dialysis patient with 2 wk hx of diarrhea. Stool heme positive. NO UGI sxs. No BM since admission!. Thus, no stool to check for infection. Has large inguinal hernia. Never had colonoscopy. May be difficult to do due to his hernia. Get stool samples if BM possible. If no further diarrhea, then patient can be discharged to home with f/u with GI later for consideration of outpt colonoscopy. Thanks.  Electronic Signatures: Lutricia Feilh, Elyn Krogh (MD)  (Signed on 23-Mar-13 10:52)  Authored  Last Updated: 23-Mar-13 10:52 by Lutricia Feilh, Karelly Dewalt (MD)

## 2014-07-18 NOTE — H&P (Signed)
PATIENT NAME:  Nathan Castillo, Nathan Castillo MR#:  [id number] DATE OF BIRTH:  [birth year]  DATE OF ADMISSION:  07/05/2011  REFERRING PHYSICIAN: Dr. Carollee Massed    PRIMARY CARE PHYSICIAN: Beverely Risen, MD   PRIMARY PULMONOLOGIST: Freda Munro, MD   PRIMARY CARDIOLOGIST: Corky Downs, MD    PRIMARY GASTROENTEROLOGIST: Lutricia Feil, MD   PRIMARY NEPHROLOGIST: [facility]    CHIEF COMPLAINT: "I feel weak".   HISTORY OF PRESENT ILLNESS: The patient is a pleasant 64 year old male with past medical history listed below who presented to the Emergency Department accompanied by two of his sisters with the above-mentioned chief complaint. He is here with generalized/global weakness which has been worsening. He was recently hospitalized at our facility from 03/22 to 06/19/2011 for diarrhea with heme positive stools of unclear etiology which was possibly viral in nature. He has been set up for an outpatient colonoscopy. His diarrhea appears to have resolved. He still, however, has some dark stools. He reports generalized weakness which has been worsening. He went to see Dr. Juel Burrow today for routine pacemaker check and his pacer was noted to be functioning fine. He notified Dr. Juel Burrow  of his weakness who advised the patient to go see his primary care physician, Dr. Beverely Risen. However, the patient did not have an appointment to see Dr. Welton Flakes today and, therefore, called Dr. Nigel Bridgeman office and was advised that he would undergo outpatient colonoscopy 07/11/2011. Later [facility] was called and the patient's sister talked to the dialysis nurse who called Dr. Austin Miles of [facility] and Dr. Austin Miles was concerned and advised the patient to come to the ER for further evaluation of his generalized weakness as there was a concern this could be related to symptomatic anemia given drop in his hemoglobin compared with previous check. Hemoglobin upon recent hospital discharge was 9.7 from 06/18/2011. Yesterday the patient's hemoglobin was  7.5 and today is 8.1. Otherwise, the patient is without specific complaints at this time. Hospitalist services were contacted for further evaluation.   PAST SURGICAL HISTORY:  1. 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  2. History of permanent pacemaker insertion 06/17/2011 by Dr. Juel Burrow.   PAST MEDICAL HISTORY:  1. Hypertension.  2. Hyperlipidemia.  3. Diabetes mellitus. 4. End-stage renal disease. The patient is on hemodialysis q. Monday, Wednesday, Friday at [facility] on [facility], followed by [facility]. The patient was last dialyzed yesterday, 07/04/2011.  5. Anemia of chronic kidney disease.  6. Seasonal allergies.  7. History of recent hospitalization 03/22 to 06/19/2011 for diarrhea with heme positive stools of unclear etiology, possibly viral in nature. The patient was recommended an outpatient colonoscopy per Gastroenterology.  8. Lumbar degenerative joint disease noted on recent x-ray.  9. Cholelithiasis.  10. History of pulmonary nodule.  11. History of mediastinal and hilar lymphadenopathy noted on chest CT 06/06/2011 as well as small left pleural effusion and stable pulmonary nodularity.  12. History of bradycardia/arrhythmia.  13. 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 Juel Burrow 06/17/2011.   ALLERGIES: No known drug allergies.   MEDICATIONS:  1. The patient was advised to take aspirin 81 mg p.o. daily in the past, however, he is currently not taking this agent.  2. Cetirizine 10 mg p.o. daily. 3. Cinacalcet 60 mg p.o. daily. 4. Lithium carbonate 1000 mg p.o. with meals and snacks.  5. Levothyroxine 50 mcg p.o. daily.  6. Nifedipine extended-release 90 mg p.o. daily. 7. Novolin 70/30 5 units  subcutaneously b.i.d. 8. Rena-Vite one  tab p.o. daily. 9. Zocor 40 mg daily. 10. Thiamine 100 mg p.o. daily.   FAMILY HISTORY: His father passed away from complications of heart disease and was a diabetic.  Mother has history of hypertension and diabetes mellitus.   SOCIAL HISTORY: Negative for tobacco, alcohol, or illicit drug use. The patient currently lives at home by himself locally and he comes accompanied by two of his sisters today who also provide additional history and are quite involved in the patient's care.  REVIEW OF SYSTEMS: CONSTITUTIONAL: The patient reports generalized weakness. He has mild fatigue. Denies fevers, chills, or recent changes in weight. Denies any pain currently. HEAD/EYES: Denies headache or blurry vision. ENT: Denies tinnitus, earache, nasal discharge, or sore throat. RESPIRATORY: Denies shortness of breath, cough, or wheezing. No hemoptysis. CARDIOVASCULAR: Denies chest pain, heart palpitations, or lower extremity edema. GI: Denies nausea, vomiting, diarrhea, constipation, or hematochezia. He does have some dark-colored stool. GU: Denies dysuria or hematuria. ENDOCRINE: Denies heat or cold intolerance. HEME/LYMPH: Denies easy bruising or bleeding but does report dark stools and recently noted to have Hemoccult positive stools. INTEGUMENTARY: Denies any rashes or lesions. MUSCULOSKELETAL: Denies any joint pain or back pain currently. He does occasionally have back pain at the moment. He has generalized muscle weakness. NEUROLOGIC: Generalized weakness. Denies headache, numbness, tingling, or dysarthria. PSYCHIATRIC: Denies depression or anxiety.   PHYSICAL EXAMINATION:   VITAL SIGNS: Temperature 98.2, pulse 90, blood pressure 170/83, respirations 18, oxygen sats 95% on room air.   GENERAL: The patient is alert and oriented. He is chronically ill appearing not acutely distressed.   HEENT: Normocephalic, atraumatic. Pupils equal, round, and reactive to light and accommodation. Extraocular muscles are intact. Anicteric sclerae. Bilateral conjunctival pallor. Hearing intact to voice. Nares without drainage. Oral mucosa moist without lesions.   NECK: Supple with full range of  motion. No JVD, lymphadenopathy, or carotid bruits bilaterally. No thyromegaly or tenderness to palpation over thyroid gland.    CHEST: Normal respiratory effort without use of accessory respiratory muscles. Lungs are clear to auscultation bilaterally without crackles, rales, or wheezes.   CARDIOVASCULAR: S1, S2 positive. Regular rate and rhythm. No murmurs, rubs, or gallops. PMI is non-lateralized.   ABDOMEN: Soft, nontender, nondistended. Normoactive bowel sounds. No hepatosplenomegaly or palpable masses. No hernias.   EXTREMITIES: No clubbing, cyanosis, or edema. Pedal pulses are palpable bilaterally.   SKIN: No suspicious rashes. Skin turgor is good.   LYMPH: No cervical lymphadenopathy.   NEUROLOGIC: Alert and oriented x3. Cranial nerves II through XII are grossly intact. No focal deficits. He has global symmetric weakness but strength appears to be preserved. Sensation is intact. No facial droop. Speech is clear. No focal deficits.   PSYCH: Appropriate affect.   LABORATORY, DIAGNOSTIC, AND RADIOLOGICAL DATA: CBC WBC 4.6, hemoglobin 8.1, hematocrit 26.5, platelets 237, MCV 81. Hemoglobin was 7.5 from yesterday and prior to this was 9.7 from 06/18/2011. CMP within normal limits except for BUN of 23, creatinine 6.87, glucose 152, serum albumin 3.2.   ASSESSMENT AND PLAN: This is a 64 year old male with past medical history of hypertension, hyperlipidemia, diabetes mellitus, anemia of chronic kidney disease, end-stage renal disease on hemodialysis q. Monday, Wednesday, Friday, history of bradycardia and AV block status post permanent pacemaker insertion, pulmonary nodule, degenerative joint disease with recent hospitalization for diarrhea with Heme positive stools here with generalized weakness.  1. Generalized weakness. Suspect this is due to symptomatic anemia and recommend hospital admission for further evaluation and  management. Since the patient has symptomatic anemia, recommend blood  transfusion and will see if the patient's symptoms improve with transfusion. Will also obtain PT evaluation. Risks, benefits, and alternatives of blood transfusion have been explained to the patient and all of his questions are answered. The patient understands the risks and he consents to transfusion and signed informed consent form will be obtained prior to transfusion. Further work-up and management to follow depending on the patient's clinical course. If his symptoms do not improve posttransfusion, will pursue further work-up for other potential causes of his weakness. Please see below for further details.  2. Worsening anemia in patient with anemia of chronic kidney disease but also with Hemoccult positive stools. As above since the patient is symptomatic with generalized weakness and fatigue, recommend blood transfusion to see if his symptoms improve. Will continue to follow hemoglobin and hematocrit closely posttransfusion. Will also keep patient on PPI therapy in the event of upper GI bleed and obtain GI consultation to arrange endoscopy. I did get a chance to speak with Dr. Mechele Collin whose help was greatly appreciated and current plan is for patient to undergo EGD tomorrow morning and then possibly colonoscopy Monday depending on the results and timing is to be decided by Gastroenterology. Will keep patient n.p.o. after midnight. The patient is currently not on any NSAIDs and was advised aspirin in the recent past but he is currently not taking this and will continue to avoid this agent for now given his anemia and bleeding risks as noted with Hemoccult positive stools.  3. End-stage renal disease on hemodialysis q. Monday, Wednesday, Friday via left upper extremity AV fistula. The patient dialyzes at [facility] on [facility], followed by [facility], was last dialyzed 07/04/2011. I spoke with Dr. Thedore Mins and plan is for the patient to undergo hemodialysis tomorrow. The patient will be  transfused blood with hemodialysis tomorrow. Nephrology will arrange and manage the patient's inpatient hemodialysis and will await further recommendations from Dr. Thedore Mins. He will also follow patient's electrolytes closely and also check phosphorous level.  4. Hypertension, currently uncontrolled. Will keep patient on nifedipine and also write for p.r.n. IV hydralazine. Monitor blood pressure closely. 5. Hyperlipidemia. Continue simvastatin.  6. Diabetes mellitus. Since the patient will be n.p.o. after midnight, will hold his Novolin 70/30 for now and keep patient on sliding scale insulin and monitor sugars closely.  7. History of pulmonary nodule. This will need continued surveillance imaging as an outpatient. The patient follows up with Dr. Freda Munro in this regard.  8. DVT prophylaxis. SCDs and TEDs.   CODE STATUS: FULL CODE.   TIME SPENT ON THIS ADMISSION: Approximately 60 minutes.      Case was discussed with the patient and his two sisters who are currently in agreement with the above current management plan. Case was also discussed with Dr. Mechele Collin and Dr. Thedore Mins.    ____________________________ Elon Alas, MD knl:drc D: 07/05/2011 18:48:11 ET T: 07/06/2011 05:53:21 ET JOB#: [id number]  cc: Elon Alas, MD, <Dictator> Lyndon Code, MD Yevonne Pax, MD Ezzard Standing. Bluford Kaufmann, MD Scot Jun, MD Mosetta Pigeon, MD [facility], Dr. Chucky May Melven Stockard MD ELECTRONICALLY SIGNED 07/17/2011 17:55

## 2014-07-18 NOTE — H&P (Signed)
PATIENT NAME:  Nathan Castillo, SILVERNAIL MR#:  [id number] DATE OF BIRTH:  07-12-1950  DATE OF ADMISSION:  06/15/2011  PRIMARY CARE PHYSICIAN: Beverely Risen, MD   CHIEF COMPLAINT: Diarrhea, weakness.   HISTORY OF PRESENT ILLNESS: The patient is a 64 year old male who comes into the Emergency Room due to ongoing diarrhea for the past 2 to 3 weeks. The patient says the diarrhea is watery in nature. He's tried taking Lomotil and even Imodium, although it has not improved his symptoms. The diarrhea has no relation to food. If he eats or doesn't eat he continues  to have diarrhea. The patient actually was going to be referred to a gastroenterologist but has not seen one yet. Today the patient went to get his echocardiogram done but felt increasingly weak and was also noted to have heme-positive stools at his primary care physician's office, and therefore was referred to the ER. The patient has had a CT scan in the past two weeks of his abdomen and pelvis with contrast which did not show any acute pathology. He also underwent an echocardiogram recently. The patient was also noted to be somewhat bradycardic when he came to the ER with heart rates dropping down to as low as the 40s. Hospitalist Service was then contacted for further treatment and evaluation.   REVIEW OF SYSTEMS: CONSTITUTIONAL: No documented fever. No weight gain, no weight loss. EYES: No blurred or double vision. ENT: No tinnitus. No postnasal drip. No redness of the oropharynx. RESPIRATORY: No cough, no wheeze, no hemoptysis. Positive dyspnea on exertion. CARDIOVASCULAR: No chest pain, no orthopnea, no palpitations or syncope. GI: No nausea, no vomiting. Positive diarrhea. Heme positive stools. No abdominal pain, no melena no hematochezia. GU: No dysuria or hematuria. ENDOCRINE: No polyuria or nocturia. No heat or cold intolerance. HEMATOLOGIC/LYMPHATIC: Positive anemia. No acute bruising or bleeding. INTEGUMENTARY: No rashes. No lesions. MUSCULOSKELETAL: No  arthritis, no swelling, and no gout. NEUROLOGIC: No numbness, no tingling, no ataxia. No seizure-type activity. PSYCHIATRIC: No anxiety, no insomnia, no ADD.   PAST MEDICAL HISTORY:  1. End-stage renal disease on hemodialysis on Monday, Wednesday, and Friday.  2. Hypertension.  3. Hyperlipidemia.  4. Secondary hyperparathyroidism.   ALLERGIES: No known drug allergies.   SOCIAL HISTORY: No smoking. No alcohol abuse. No illicit drug abuse. He lives at home by himself.   FAMILY HISTORY: Mother is alive still. Father passed away from complications of heart disease. He was diabetic. Mother also has high blood pressure and diabetes.   CURRENT MEDICATIONS:  1. Aspirin 81 mg daily.  2. Atenolol 50 mg daily. 3. Cetirizine 10 mg daily.  4. Cinacalcet 60 mg daily.  5. Nifedipine 10 mg daily.  6. Novolin 70/30, 5 units b.i.d.  7. Rena-Vite daily.  8. Simvastatin 40 mg daily.  9. Thiamine 100 mg daily.   PHYSICAL EXAMINATION:  VITAL SIGNS: On admission, temperature is 98.7, pulse 72, respirations 18, blood pressure 153/74, saturations 94% on room air.   GENERAL: He is a pleasant-appearing male in no apparent distress.   HEENT: Atraumatic, normocephalic. Extraocular muscles are intact. Pupils are equal and reactive to light. Sclerae are anicteric. No conjunctival injection. No pharyngeal erythema.   NECK: Supple. No jugular venous distention, no bruits, no lymphadenopathy, no thyromegaly.   HEART: He does have a bradycardic rhythm. No murmurs, no rubs, no clicks.   LUNGS: Clear to auscultation bilaterally. No rales, no rhonchi, no wheezes.   ABDOMEN: Soft, flat, nontender, nondistended. Has good bowel sounds. No hepatosplenomegaly appreciated.  EXTREMITIES: He does have an AV fistula on his left upper extremity, has good bruit and good thrill, +2 pedal and radial pulses bilaterally. No evidence of any cyanosis or clubbing. No  peripheral edema.   NEUROLOGICAL: He is alert, awake, and  oriented x3 with no focal motor or sensory deficits appreciated bilaterally.   SKIN: Moist and warm with no rashes appreciated.   LYMPHATIC: There is no cervical or axillary lymphadenopathy.   LABORATORY, DIAGNOSTIC, AND RADIOLOGICAL DATA:  Serum glucose 140, BUN 16, creatinine 4.4, sodium 142, potassium 3.2, chloride 99, bicarbonate 34.  The patient's LFTs are within normal limits.  CK is 43, troponin less than 0.02.  White cell count 4.9, hemoglobin 10.4, hematocrit 33.2, platelet count 221.  Prothrombin time is 15.0, INR 1.1.  The patient did have a CT of the chest done without contrast which showed subtle areas of subcentimeter nodularity are seen in both lower lobes and in the right middle lobe. No nodules in the upper lobes. No evidence of pneumonia or pulmonary edema. Mild enlarged mediastinal and hilar lymph nodes. These are not as well evaluated as would be liked given noncontrast nature of the study.   ASSESSMENT AND PLAN: This is a 64 year old male with a history of end-stage renal disease on hemodialysis, diabetes, history of previous cerebrovascular accident, hypertension, hyperlipidemia, who presents to the hospital due to diarrhea for the past 2 to 3 weeks, also noted to have heme positive stools and also noted to have a bradycardic rhythm.   1. Diarrhea with heme positive stools: The exact etiology of this is currently unclear. This seems like chronic diarrhea. It is not improved with Lomotil or Imodium. I will check a stool for C. difficile and also a comprehensive culture. His hemoglobin is currently low, although I do not have a baseline hemoglobin to compare with.  I will follow serial hemoglobins, get a GI consult. The patient has had a recent CT of the abdomen and pelvis done with contrast showing no acute pathology. Therefore, I will repeat any imaging study at this time. Continue supportive care. Give him gentle IV fluids and antiemetics for now. I will hold his aspirin for  now given his heme positive stools.  2. Bradycardia/arrhythmia: The patient dropped his heart rate to as low as the 40s in the ER. His EKG shows a possible junctional rhythm. I will hold his atenolol for now, get a Cardiology consult with Dr. Juel BurrowMasoud. The patient has had a recent echocardiogram; therefore, I will not repeat that at this time.  3. Diabetes: We will continue Novolin 70/30 and follow his blood sugars.  4. Hypertension: Continue nifedipine, hold atenolol due to bradycardia.  5. Hyperlipidemia: Continue simvastatin.  6. Abnormal CT scan: The patient was noted to have some pulmonary nodules and some lymphadenopathy seen on the CT of the chest. I will get a Pulmonary consult to evaluate this.   CODE STATUS: The patient is a FULL CODE.   The plan was discussed with the patient and the patient's family, and they are in agreement.   TIME SPENT ON DISCHARGE: 50 minutes.   ____________________________ Rolly PancakeVivek J. Cherlynn KaiserSainani, MD vjs:cbb D: 06/15/2011 19:29:42 ET T: 06/16/2011 08:27:56 ET JOB#: [id number]  cc: Rolly PancakeVivek J. Cherlynn KaiserSainani, MD, <Dictator> Lyndon CodeFozia M. Khan, MD Houston SirenVIVEK J Cary Lothrop MD ELECTRONICALLY SIGNED 06/21/2011 10:46

## 2014-07-18 NOTE — Discharge Summary (Signed)
PATIENT NAME:  Nathan Castillo, Nathan Castillo MR#:  [id number] DATE OF BIRTH:  1950-05-30  DATE OF ADMISSION:  06/15/2011 DATE OF DISCHARGE:  06/19/2011  DISCHARGE DIAGNOSES: 1. Diarrhea with heme positive stool of unclear etiology, possibly viral, now better.  2. Bradycardia/arrhythmia patient. The patient's heart rate was as low as in the 40's, status post pacemaker by Dr. Juel Burrow.  3. Pulmonary nodule. Will require outpatient follow-up with Dr. Freda Munro.  4. End-stage renal disease, hemodialysis Monday, Wednesday, Friday.   SECONDARY DIAGNOSES:  1. End-stage renal disease on hemodialysis Monday, Wednesday, Friday.  2. Hypertension.  3. Hyperlipidemia.  4. Secondary hyperparathyroidism.   CONSULTATIONS:  1. GI, Dr. Bluford Kaufmann  2. Cardiology, Dr. Juel Burrow   3. Physical therapy  4. Pulmonary, Dr. Freda Munro   5. Nephrology, Dr.  Wynelle Link   PROCEDURES/RADIOLOGY:  1. Pacemaker placement by Dr. Juel Burrow on March 24th due to 2:1 block with history of syncope.  2. CT scan of the chest without contrast on March 22nd showed small subcentimeter pulmonary nodule. No evidence of pneumonia or pulmonary edema. Enlarged cardiac chambers. Mild enlargement of mediastinal and hilar lymph nodes. Gallstones. Numerous calcifications within the liver. 3. Lumbar spine x-ray on March 22nd showed mild degenerative joint disease of the lumbar spine. No compression fracture.  4. Chest x-ray on March 24th showed possible low-grade CHF. No pneumothorax or pleural effusion. No pneumonia.  5. CT scan of the chest with contrast on March 24th showed stable appearance of pulmonary parenchymal nodularity. Stable mediastinal and hilar lymph nodes. New small left pleural effusion. Pulmonary interstitial edema. Gallstone near the gallbladder neck.   MAJOR LABORATORY PANEL: HBsAg was negative on March 25th. Stool for WBCs, salmonella, shigella, Escherichia coli, or bacteria were negative. Stool for C. difficile was negative on March 26th.  Hemoccult stool was positive.  HISTORY AND SHORT HOSPITAL COURSE: The patient is a 64 year old male with the above-mentioned medical problems who was admitted for diarrhea and heme positive stool. CT scan of the abdomen and pelvis performed on March 14th showed no acute pathology but CT Chest on Mar 22nd showed Pulmonary Nodules for which Pulmonary consult was obtained who recommended outpatient follow-up. The patient also was evaluated by GI, Dr. Bluford Kaufmann, who recommended stool studies and possible outpatient colonoscopy. The patient's stool studies remained negative. He was also followed throughout the inpatient course by Nephrology for inpatient hemodialysis for his end-stage renal disease. The patient was evaluated by Cardiology, Dr. Juel Burrow, for significant bradycardia and underwent pacemaker placement by him on March 24th. Please see Dr. Hilbert Odor dictated history and physical for further details. The patient was evaluated by physical therapy and he did very well and was discharged home in stable condition. His diarrhea was resolved and he was feeling significantly better.  On the date of discharge, his vital signs were as follows: Temperature 97.2, heart rate 61 per minute, respirations 18 per minute, blood pressure 116/68 mmHg. He was saturating 91% on room air.   PERTINENT PHYSICAL EXAMINATION: CARDIOVASCULAR: S1, S2 normal. No murmur, rubs, or gallop. LUNGS: Clear to auscultation bilaterally. No wheezing, rales, rhonchi, or crepitation. ABDOMEN: Soft, benign. NEUROLOGIC: Nonfocal examination. All other physical examination remained at baseline.   DISCHARGE MEDICATIONS:  1. Aspirin 81 mg p.o. daily. 2. Nifedipine 90 mg p.o. daily.  3. Cetirizine 10 mg p.o. daily. 4. Cinacalcet 60 mg p.o. daily.  5. Fosrenol once daily with meals and snacks.  6. Thiamine 100 mg p.o. daily. 7. Novolin 70/30 5 units sub-Q b.i.d.  8. Simvastatin  40 mg p.o. daily. 9. Rena-Vite 1 tablet p.o. daily.  10. Sensipar 60 mg  p.o. daily.   DISCHARGE DIET: Low sodium 1800 ADA.   DISCHARGE ACTIVITY: As tolerated.   DISCHARGE INSTRUCTIONS AND FOLLOW-UP: 1. The patient was instructed to keep his dressing dry. 2. Do not take atenolol secondary to bradycardia.  3. He will need follow-up with primary care physician, Dr. Beverely RisenFozia Khan, in 1 to 2 weeks. 4. Follow-up with Dr. Freda MunroSaadat Khan from Pulmonary in 2 to 3 weeks. 5. Follow-up with Dr. Juel BurrowMasoud in two weeks. 6. Follow-up with Dr. Bluford Kaufmannh in 3 to 4 weeks.   TOTAL TIME DISCHARGING THIS PATIENT: 55 minutes.   ____________________________ Ellamae SiaVipul S. Sherryll BurgerShah, MD vss:drc D: 06/21/2011 14:29:54 ET T: 06/23/2011 14:53:23 ET JOB#: [id number]  cc: Ollivander See S. Sherryll BurgerShah, MD, <Dictator> Lyndon CodeFozia M. Khan, MD Yevonne PaxSaadat A. Khan, MD Corky DownsJaved Masoud, MD Ezzard StandingPaul Y. Bluford Kaufmannh, MD Laverda SorensonSarath C. Kolluru, MD Patricia PesaVIPUL S Oakley Orban MD ELECTRONICALLY SIGNED 06/24/2011 22:42

## 2014-07-18 NOTE — Consult Note (Signed)
Chief Complaint:   Subjective/Chief Complaint Covering for Dr. Mechele CollinElliott. Hgb going up with blood transfusion. Cautery of gastric AVM.   VITAL SIGNS/ANCILLARY NOTES: **Vital Signs.:   13-Apr-13 10:14   Temperature Temperature (F) 97.9   Celsius 36.6   Temperature Source axillary   Pulse Pulse 78   Respirations Respirations 20   Systolic BP Systolic BP 119   Diastolic BP (mmHg) Diastolic BP (mmHg) 74   Mean BP 89   Pulse Ox % Pulse Ox % 92   Pulse Ox Activity Level  At rest   Oxygen Delivery Room Air/ 21 %   Brief Assessment:   Cardiac Regular    Respiratory clear BS    Gastrointestinal Normal   Routine Hem:  13-Apr-13 04:39    WBC (CBC) 5.7   RBC (CBC) 3.97   Hemoglobin (CBC) 10.0   Hematocrit (CBC) 32.2   Platelet Count (CBC) 212   MCV 81   MCH 25.2   MCHC 31.2   RDW 20.2   Neutrophil % 77.7   Lymphocyte % 12.9   Monocyte % 6.7   Eosinophil % 1.7   Basophil % 1.0   Neutrophil # 4.4   Lymphocyte # 0.7   Monocyte # 0.4   Eosinophil # 0.1   Basophil # 0.1   Assessment/Plan:  Assessment/Plan:   Assessment Anemia with heme positive stool.    Plan Switch to clear liquid diet tomorrow and bowel prep tomorrow afternoon for colonoscopy on Monday afternoon. Need to make sure patient receives HD early Monday. Thanks   Electronic Signatures: Lutricia Feilh, Jelina Paulsen (MD)  (Signed 13-Apr-13 10:38)  Authored: Chief Complaint, VITAL SIGNS/ANCILLARY NOTES, Brief Assessment, Lab Results, Assessment/Plan   Last Updated: 13-Apr-13 10:38 by Lutricia Feilh, Jagger Beahm (MD)
